# Patient Record
Sex: Female | Born: 1990 | Race: White | Hispanic: Yes | Marital: Single | Smoking: Never smoker
Health system: Southern US, Community
[De-identification: ages and names within clinical notes are randomized; demographics above are authoritative.]

## PROBLEM LIST (undated history)

## (undated) DIAGNOSIS — E119 Type 2 diabetes mellitus without complications: Secondary | ICD-10-CM

## (undated) DIAGNOSIS — D563 Thalassemia minor: Secondary | ICD-10-CM

## (undated) HISTORY — DX: Type 2 diabetes mellitus without complications: E11.9

## (undated) HISTORY — DX: Thalassemia minor: D56.3

## (undated) HISTORY — PX: NO PAST SURGERIES: SHX2092

---
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
## 2011-03-02 LAB — POCT PREGNANCY, URINE: Preg Test, Ur: NEGATIVE

## 2011-03-02 LAB — POCT I-STAT, CHEM 8
BUN: 9 mg/dL (ref 6–23)
Chloride: 106 mEq/L (ref 96–112)
Creatinine, Ser: 0.6 mg/dL (ref 0.4–1.2)
Glucose, Bld: 100 mg/dL — ABNORMAL HIGH (ref 70–99)
Potassium: 4 mEq/L (ref 3.5–5.1)

## 2011-03-02 LAB — HEPATIC FUNCTION PANEL
ALT: 20 U/L (ref 0–35)
AST: 23 U/L (ref 0–37)
Albumin: 4.1 g/dL (ref 3.5–5.2)
Bilirubin, Direct: 0.1 mg/dL (ref 0.0–0.3)
Total Protein: 7.9 g/dL (ref 6.0–8.3)

## 2011-10-23 DIAGNOSIS — L0501 Pilonidal cyst with abscess: Secondary | ICD-10-CM | POA: Insufficient documentation

## 2017-05-03 ENCOUNTER — Ambulatory Visit (INDEPENDENT_AMBULATORY_CARE_PROVIDER_SITE_OTHER): Payer: 59 | Admitting: Obstetrics and Gynecology

## 2017-05-03 ENCOUNTER — Encounter: Payer: Self-pay | Admitting: Obstetrics and Gynecology

## 2017-05-03 VITALS — BP 131/87 | HR 91 | Ht 62.0 in | Wt 179.0 lb

## 2017-05-03 DIAGNOSIS — E119 Type 2 diabetes mellitus without complications: Secondary | ICD-10-CM | POA: Insufficient documentation

## 2017-05-03 DIAGNOSIS — Z3689 Encounter for other specified antenatal screening: Secondary | ICD-10-CM

## 2017-05-03 DIAGNOSIS — O99211 Obesity complicating pregnancy, first trimester: Secondary | ICD-10-CM | POA: Diagnosis not present

## 2017-05-03 DIAGNOSIS — O0991 Supervision of high risk pregnancy, unspecified, first trimester: Secondary | ICD-10-CM

## 2017-05-03 DIAGNOSIS — O99210 Obesity complicating pregnancy, unspecified trimester: Secondary | ICD-10-CM | POA: Insufficient documentation

## 2017-05-03 DIAGNOSIS — E669 Obesity, unspecified: Secondary | ICD-10-CM | POA: Diagnosis not present

## 2017-05-03 DIAGNOSIS — O0990 Supervision of high risk pregnancy, unspecified, unspecified trimester: Secondary | ICD-10-CM | POA: Insufficient documentation

## 2017-05-03 DIAGNOSIS — B3731 Acute candidiasis of vulva and vagina: Secondary | ICD-10-CM

## 2017-05-03 DIAGNOSIS — Z3401 Encounter for supervision of normal first pregnancy, first trimester: Secondary | ICD-10-CM

## 2017-05-03 DIAGNOSIS — Z113 Encounter for screening for infections with a predominantly sexual mode of transmission: Secondary | ICD-10-CM

## 2017-05-03 DIAGNOSIS — E118 Type 2 diabetes mellitus with unspecified complications: Secondary | ICD-10-CM

## 2017-05-03 DIAGNOSIS — B373 Candidiasis of vulva and vagina: Secondary | ICD-10-CM

## 2017-05-03 NOTE — Progress Notes (Signed)
New OB Note  05/03/2017   Clinic: [facility]  Chief Complaint: NOB  Transfer of Care Patient: no  History of Present Illness: Haley [name] is a 26 y.o. G1 @ 7/1 weeks (Declo 1/23, based on Patient's last menstrual period was 03/14/2017 (exact date).=7wk u/s).  Preg complicated by has Supervision of high risk pregnancy, antepartum, first trimester; Obesity in pregnancy, antepartum; Obesity (BMI 30-39.9); and ?DM2 on her problem list.   Any events prior to today's visit: no Her periods were: regular, qmonth She was using no method when she conceived.  She has Positive signs or symptoms of nausea/vomiting of pregnancy. Very mild nausea She has Negative signs or symptoms of miscarriage or preterm labor. Had some spotting today.  On any different medications around the time she conceived/early pregnancy: No   ROS: A 12-point review of systems was performed and negative, except as stated in the above HPI.  OBGYN History: As per HPI. OB History  Gravida Para Term Preterm AB Living  1            SAB TAB Ectopic Multiple Live Births               # Outcome Date GA Lbr Len/2nd Weight Sex Delivery Anes PTL Lv  1 Current              Any issues with any prior pregnancies: not applicable Prior children are healthy, doing well, and without any problems or issues: not applicable History of pap smears: Yes. Last pap smear 2016 @ [facility] and results were negative per patient History of STIs: No   Past Medical History: Past Medical History:  Diagnosis Date  . Diabetes mellitus without complication [facility])     Past Surgical History: Past Surgical History:  Procedure Laterality Date  . NO PAST SURGERIES      Family History:  No family history on file. She denies any female cancers, bleeding or blood clotting disorders except for a grandmother with breast cancer in her 73s  She denies any history of mental retardation, birth defects or genetic disorders in her or the  FOB's history  Social History:  Social History   Social History  . Marital status: Single    Spouse name: N/A  . Number of children: N/A  . Years of education: N/A   Occupational History  . Not on file.   Social History Main Topics  . Smoking status: Never Smoker  . Smokeless tobacco: Never Used  . Alcohol use Yes     Comment: prior to known pregnancy  . Drug use: No  . Sexual activity: Yes    Birth control/ protection: None   Other Topics Concern  . Not on file   Social History Narrative  . No narrative on file  works as Theatre manager (precautions given)  Allergy: No Known Allergies  Health Maintenance:  Mammogram Up to Date: not applicable  Current Outpatient Medications: PNV  Physical Exam:   BP 131/87   Pulse 91   Ht 5' 2"  (1.575 m)   Wt 179 lb (81.2 kg)   LMP 03/14/2017 (Exact Date)   BMI 32.74 kg/m  Body mass index is 32.74 kg/m. Contractions: Not present Vag. Bleeding: Scant. Fundal height: not applicable FHTs: 867E  General appearance: Well nourished, well developed female in no acute distress.  Neck:  Supple, normal appearance, and no thyromegaly  Cardiovascular: S1, S2 normal, no murmur, rub or gallop, regular rate and rhythm Respiratory:  Clear to  auscultation bilateral. Normal respiratory effort Abdomen: positive bowel sounds and no masses, hernias; diffusely non tender to palpation, non distended Breasts: breasts appear normal, no suspicious masses, no skin or nipple changes or axillary nodes, normal palpation. Neuro/Psych:  Normal mood and affect.  Skin:  Warm and dry.  Lymphatic:  No inguinal lymphadenopathy.   Pelvic exam: is not limited by body habitus EGBUS: within normal limits, Vagina: within normal limits and with no blood in the vault, +cottage white d/c in vault  Cervix: normal appearing cervix without discharge or lesions, closed/long/high, Uterus:  nonenlarged, and Adnexa:  normal adnexa and no mass, fullness,  tenderness  Laboratory: none  Imaging:  Per RN note: SLIUP 7/0, FHR 140s  Assessment: pt stable  Plan: 1. Encounter for supervision of normal first pregnancy in first trimester Routine care. Declines 1st trimester screen. Behavioral methods for n/v of pregnancy d/w pt.  - [place] bedside; Future - Obstetric Panel, Including HIV - Cystic Fibrosis Mutation 97 - Hemoglobinopathy Evaluation - Culture, OB Urine - Urine cytology ancillary only - Glucose tolerance, 1 hour - Comp Met (CMET) - TSH - Protein / creatinine ratio, urine - SMN1 Copy Number Analysis - HgB A1c  2. Supervision of high risk pregnancy, antepartum, first trimester See above  3. Obesity in pregnancy, antepartum Baseline pre-x labs, tsh  4. Obesity (BMI 30-39.9) See above  5. Type 2 diabetes mellitus with complication, unspecified whether long term insulin use (HCC) Pt states she went to [facility] and she had actual DM2 and not pre-DM and was on insulin for a time but came off it b/c she was able to switch her diet. Last seen in the last year or two. MR request sent to [facility] and [facility] for pap results. Early 1h and a1c done.   Problem list reviewed and updated.  Follow up in 4 weeks.  The nature of [facility] with multiple MDs and other Advanced Practice Providers was explained to patient; also emphasized that residents, students are part of our team.  >50% of 25 min visit spent on counseling and coordination of care.     Durene Romans MD Attending [facility] for [facility])

## 2017-05-03 NOTE — Progress Notes (Signed)
Bedside TV US performed, SIUP noted with + FHR 145 and CRL measuring 7w 0d which correlates with LMP.

## 2017-05-04 LAB — URINE CYTOLOGY ANCILLARY ONLY
Chlamydia: NEGATIVE
Neisseria Gonorrhea: NEGATIVE

## 2017-05-07 ENCOUNTER — Encounter: Payer: Self-pay | Admitting: *Deleted

## 2017-05-11 ENCOUNTER — Encounter: Payer: Self-pay | Admitting: Obstetrics and Gynecology

## 2017-05-11 DIAGNOSIS — D563 Thalassemia minor: Secondary | ICD-10-CM

## 2017-05-11 HISTORY — DX: Thalassemia minor: D56.3

## 2017-05-11 LAB — SMN1 COPY NUMBER ANALYSIS (SMA CARRIER SCREENING)

## 2017-05-11 LAB — HEMOGLOBINOPATHY EVALUATION
Ferritin: 29 ng/mL (ref 15–150)
HGB SOLUBILITY: NEGATIVE
HGB VARIANT: 0 %
Hgb A2 Quant: 2.1 % (ref 1.8–3.2)
Hgb A: 97.9 % (ref 96.4–98.8)
Hgb C: 0 %
Hgb F Quant: 0 % (ref 0.0–2.0)
Hgb S: 0 %

## 2017-05-11 LAB — COMPREHENSIVE METABOLIC PANEL
A/G RATIO: 1.3 (ref 1.2–2.2)
ALK PHOS: 65 IU/L (ref 39–117)
ALT: 12 IU/L (ref 0–32)
AST: 15 IU/L (ref 0–40)
Albumin: 4.3 g/dL (ref 3.5–5.5)
BUN/Creatinine Ratio: 14 (ref 9–23)
BUN: 10 mg/dL (ref 6–20)
CHLORIDE: 97 mmol/L (ref 96–106)
CO2: 19 mmol/L (ref 18–29)
Calcium: 9.2 mg/dL (ref 8.7–10.2)
Creatinine, Ser: 0.7 mg/dL (ref 0.57–1.00)
GFR calc Af Amer: 139 mL/min/{1.73_m2} (ref >59)
GFR calc non Af Amer: 121 mL/min/{1.73_m2} (ref >59)
Globulin, Total: 3.2 g/dL (ref 1.5–4.5)
Glucose: 444 mg/dL — ABNORMAL HIGH (ref 65–99)
POTASSIUM: 4.3 mmol/L (ref 3.5–5.2)
Sodium: 133 mmol/L — ABNORMAL LOW (ref 134–144)
Total Protein: 7.5 g/dL (ref 6.0–8.5)

## 2017-05-11 LAB — OBSTETRIC PANEL, INCLUDING HIV
Antibody Screen: NEGATIVE
BASOS ABS: 0 10*3/uL (ref 0.0–0.2)
Basos: 0 %
EOS (ABSOLUTE): 0.1 10*3/uL (ref 0.0–0.4)
Eos: 1 %
HEP B S AG: NEGATIVE
HIV Screen 4th Generation wRfx: NONREACTIVE
Hematocrit: 41.7 % (ref 34.0–46.6)
Hemoglobin: 13.2 g/dL (ref 11.1–15.9)
IMMATURE GRANS (ABS): 0 10*3/uL (ref 0.0–0.1)
IMMATURE GRANULOCYTES: 0 %
LYMPHS ABS: 1.5 10*3/uL (ref 0.7–3.1)
LYMPHS: 19 %
MCH: 24.6 pg — AB (ref 26.6–33.0)
MCHC: 31.7 g/dL (ref 31.5–35.7)
MCV: 78 fL — ABNORMAL LOW (ref 79–97)
Monocytes Absolute: 0.3 10*3/uL (ref 0.1–0.9)
Monocytes: 4 %
NEUTROS PCT: 76 %
Neutrophils Absolute: 6.2 10*3/uL (ref 1.4–7.0)
PLATELETS: 179 10*3/uL (ref 150–379)
RBC: 5.36 x10E6/uL — AB (ref 3.77–5.28)
RDW: 15.7 % — ABNORMAL HIGH (ref 12.3–15.4)
RPR: NONREACTIVE
Rh Factor: POSITIVE
Rubella Antibodies, IGG: 1.94 index (ref >0.99)
WBC: 8.1 10*3/uL (ref 3.4–10.8)

## 2017-05-11 LAB — URINE CULTURE, OB REFLEX

## 2017-05-11 LAB — PROTEIN / CREATININE RATIO, URINE: CREATININE, UR: 26.9 mg/dL

## 2017-05-11 LAB — TSH: TSH: 1.37 u[IU]/mL (ref 0.450–4.500)

## 2017-05-11 LAB — HEMOGLOBIN A1C
ESTIMATED AVERAGE GLUCOSE: 232 mg/dL
Hgb A1c MFr Bld: 9.7 % — ABNORMAL HIGH (ref 4.8–5.6)

## 2017-05-11 LAB — CYSTIC FIBROSIS MUTATION 97: Interpretation: NOT DETECTED

## 2017-05-11 LAB — ALPHA-THALASSEMIA

## 2017-05-11 LAB — CULTURE, OB URINE

## 2017-05-11 LAB — GLUCOSE TOLERANCE, 1 HOUR: GLUCOSE, 1HR PP: 446 mg/dL — AB (ref 65–199)

## 2017-05-12 ENCOUNTER — Encounter: Payer: Self-pay | Admitting: Obstetrics and Gynecology

## 2017-05-12 DIAGNOSIS — R8271 Bacteriuria: Secondary | ICD-10-CM | POA: Insufficient documentation

## 2017-05-13 ENCOUNTER — Telehealth: Payer: Self-pay | Admitting: Obstetrics and Gynecology

## 2017-05-13 NOTE — Telephone Encounter (Signed)
OB Telephone Note Patient called at [phone] but it states it's not in service. Patient called at [phone] and generic VM and pt [facility] and ask to speak to attending on call to go over lab results. If she doesn't get VM until business hours on Monday, pt told to call the clinic  Patient needs to be told: 1) she has poorly controlled diabetes and needs to start checking her blood sugars and likely needs to at least be started on metformin while more BS numbers are obtained 2) pt needs to tell us pharmacy to send in keflex for +UCx and if we need to send in any DM testing supplies   Cornelia Copaharlie Aristide Waggle, Jr MD Attending [facility] 05/13/2017 Time: 1152am

## 2017-05-14 ENCOUNTER — Telehealth: Payer: Self-pay | Admitting: *Deleted

## 2017-05-14 ENCOUNTER — Telehealth: Payer: Self-pay | Admitting: Obstetrics and Gynecology

## 2017-05-14 ENCOUNTER — Encounter: Payer: Self-pay | Admitting: *Deleted

## 2017-05-14 DIAGNOSIS — E118 Type 2 diabetes mellitus with unspecified complications: Secondary | ICD-10-CM

## 2017-05-14 MED ORDER — CEPHALEXIN 500 MG PO CAPS: 500.0000 mg | ORAL_CAPSULE | Freq: Three times a day (TID) | ORAL | 0 refills | Status: DC

## 2017-05-14 MED ORDER — METFORMIN HCL 1000 MG PO TABS: 1000.0000 mg | ORAL_TABLET | Freq: Two times a day (BID) | ORAL | 3 refills | Status: DC

## 2017-05-14 MED ORDER — ACCU-CHEK FASTCLIX LANCETS MISC: 1.0000 [IU] | Freq: Four times a day (QID) | 12 refills | Status: AC

## 2017-05-14 MED ORDER — GLUCOSE BLOOD VI STRP: ORAL_STRIP | 12 refills | Status: DC

## 2017-05-14 MED ORDER — ACCU-CHEK NANO SMARTVIEW W/DEVICE KIT: 1.0000 | PACK | 0 refills | Status: AC

## 2017-05-14 NOTE — Telephone Encounter (Signed)
OB Telephone Note  Patient called at [phone]. VM again left  Cornelia Copaharlie Esdras Delair, Jr MD Attending [facility] 05/14/2017 Time: 1357

## 2017-05-14 NOTE — Telephone Encounter (Signed)
Spoke to pt about lab results - pt understands she is to pick up meds/supplies from pharmacy and follow up next week

## 2017-05-17 ENCOUNTER — Encounter: Payer: Self-pay | Admitting: Obstetrics & Gynecology

## 2017-05-23 ENCOUNTER — Encounter: Payer: Self-pay | Admitting: Family Medicine

## 2017-05-23 ENCOUNTER — Encounter: Payer: Self-pay | Admitting: *Deleted

## 2017-05-23 ENCOUNTER — Ambulatory Visit: Payer: 59 | Admitting: Family Medicine

## 2017-05-23 NOTE — Progress Notes (Signed)
SUBJECTIVE: 26 y.o. female in office today to follow up lab results. Medication compliance: compliant all of the time.     Current Outpatient Prescriptions  Medication Sig Dispense Refill  . ACCU-CHEK FASTCLIX LANCETS MISC 1 Units by Percutaneous route 4 (four) times daily. 100 each 12  . Blood Glucose Monitoring Suppl (ACCU-CHEK NANO SMARTVIEW) w/Device KIT 1 kit by Subdermal route as directed. Check blood sugars for fasting, and two hours after breakfast, lunch and dinner (4 checks daily) 1 kit 0  . cephALEXin (KEFLEX) 500 MG capsule Take 1 capsule (500 mg total) by mouth 3 (three) times daily. 21 capsule 0  . glucose blood (ACCU-CHEK SMARTVIEW) test strip Use as instructed to check blood sugars 100 each 12  . metFORMIN (GLUCOPHAGE) 1000 MG tablet Take 1 tablet (1,000 mg total) by mouth 2 (two) times daily with a meal. 60 tablet 3  . Prenatal Vit-Fe Fumarate-FA (PRENATAL VITAMIN PO) Take 1 tablet by mouth daily.     No current facility-administered medications for this visit.     OBJECTIVE: Appearance: alert, well appearing, and in no distress, oriented to person, place, and time and well hydrated. LMP 03/14/2017 (Exact Date)   ASSESSMENT: Diabetes Mellitus: needs improvement  PLAN: Issues reviewed with her: referral to Diabetic Education department and Appointment made for tomorrow. Advised pt to start Baby ASA when she is [redacted] weeks GA.

## 2017-05-24 ENCOUNTER — Ambulatory Visit: Payer: 59 | Admitting: *Deleted

## 2017-05-28 ENCOUNTER — Ambulatory Visit (INDEPENDENT_AMBULATORY_CARE_PROVIDER_SITE_OTHER): Payer: 59 | Admitting: Obstetrics & Gynecology

## 2017-05-28 VITALS — BP 138/87 | HR 94 | Wt 180.0 lb

## 2017-05-28 DIAGNOSIS — B379 Candidiasis, unspecified: Secondary | ICD-10-CM

## 2017-05-28 DIAGNOSIS — O98811 Other maternal infectious and parasitic diseases complicating pregnancy, first trimester: Secondary | ICD-10-CM

## 2017-05-28 DIAGNOSIS — B373 Candidiasis of vulva and vagina: Secondary | ICD-10-CM

## 2017-05-28 DIAGNOSIS — O0991 Supervision of high risk pregnancy, unspecified, first trimester: Secondary | ICD-10-CM

## 2017-05-28 DIAGNOSIS — O24319 Unspecified pre-existing diabetes mellitus in pregnancy, unspecified trimester: Secondary | ICD-10-CM

## 2017-05-28 DIAGNOSIS — O24311 Unspecified pre-existing diabetes mellitus in pregnancy, first trimester: Secondary | ICD-10-CM

## 2017-05-28 DIAGNOSIS — Z3689 Encounter for other specified antenatal screening: Secondary | ICD-10-CM

## 2017-05-28 MED ORDER — ASPIRIN EC 81 MG PO TBEC: 81.0000 mg | DELAYED_RELEASE_TABLET | Freq: Every day | ORAL | 2 refills | Status: DC

## 2017-05-28 MED ORDER — GLYBURIDE 2.5 MG PO TABS: 2.5000 mg | ORAL_TABLET | Freq: Two times a day (BID) | ORAL | 3 refills | Status: DC

## 2017-05-28 MED ORDER — TERCONAZOLE 0.4 % VA CREA: 1.0000 | TOPICAL_CREAM | Freq: Every day | VAGINAL | 0 refills | Status: DC

## 2017-05-28 NOTE — Progress Notes (Signed)
   PRENATAL VISIT NOTE  Subjective:  Haley [name] is a 26 y.o. G1P0000 at 87w5dbeing seen today for ongoing prenatal care.  She is currently monitored for the following issues for this high-risk pregnancy and has Supervision of high risk pregnancy, antepartum, first trimester; Obesity in pregnancy, antepartum; Obesity (BMI 30-39.9); DM (diabetes mellitus), type 2 (HColbert; Alpha thalassemia trait; GBS bacteriuria; and Preexisting diabetes complicating pregnancy, antepartum on her problem list.  Patient reports no complaints.  Contractions: Not present. Vag. Bleeding: None.  Movement: Absent. Denies leaking of fluid.   The following portions of the patient's history were reviewed and updated as appropriate: allergies, current medications, past family history, past medical history, past social history, past surgical history and problem list. Problem list updated.  Objective:   Vitals:   05/28/17 1432  BP: 138/87  Pulse: 94  Weight: 180 lb (81.6 kg)    Fetal Status: Fetal Heart Rate (bpm): 175-US   Movement: Absent     General:  Alert, oriented and cooperative. Patient is in no acute distress.  Skin: Skin is warm and dry. No rash noted.   Cardiovascular: Normal heart rate noted  Respiratory: Normal respiratory effort, no problems with respiration noted  Abdomen: Soft, gravid, appropriate for gestational age. Pain/Pressure: Present     Pelvic:  Cervical exam deferred        Extremities: Normal range of motion.  Edema: None  Mental Status: Normal mood and affect. Normal behavior. Normal judgment and thought content.   Assessment and Plan:  Pregnancy: G1P0000 at 127w5d1. Preexisting diabetes complicating pregnancy, antepartum Reports elevated blood sugars in 160s-190s for fastings and evenings.  On Metformin 1000 mg daily. Has not met with DM education yet.  Recent HgA1C was 9.7.  Will add Glyburide 2.5 mg bid for now, instructed about how to check her blood sugars, proper diet and  exercise recommended. Will start Aspirin in about 1.5 weeks.  Anatomy scan and fetal ECHO ordered; she will also get eye exam soon.  - glyBURIDE (DIABETA) 2.5 MG tablet; Take 1 tablet (2.5 mg total) by mouth 2 (two) times daily with a meal.  Dispense: 60 tablet; Refill: 3 - aspirin EC 81 MG tablet; Take 1 tablet (81 mg total) by mouth daily. Take after 12 weeks for prevention of preeclampsia later in pregnancy  Dispense: 300 tablet; Refill: 2 - [place] OB DETAIL +14 WK; Future - [place] Echocardiography; Future  2. Yeast infection Reports having yeast infection after recent antibiotics. - terconazole (TERAZOL 7) 0.4 % vaginal cream; Place 1 applicator vaginally at bedtime. Use for seven days  Dispense: 45 g; Refill: 0  3. Encounter for fetal anatomic survey Anatomy scan ordered - [place] OB DETAIL +14 WK; Future  4. Supervision of high risk pregnancy, antepartum, first trimester Declined first trimester screening; considering NIPS. No other complaints or concerns.  Routine obstetric precautions reviewed. Please refer to After Visit Summary for other counseling recommendations.  Return in about 2 weeks (around 06/11/2017) for OB Visit.   UgVerita SchneidersMD

## 2017-05-28 NOTE — Progress Notes (Signed)
Pt here today for OB fu, was not able to keep appt with Nutrition and Diabetes management.  Pt reports fasting BS 160-190s and in the evenings as well.

## 2017-05-28 NOTE — Patient Instructions (Signed)
Type 1 or Type 2 Diabetes Mellitus During Pregnancy, Self Care Caring for yourself during your pregnancy when you have type 1 diabetes (type 1 diabetes mellitus) or type 2 diabetes (type 2 diabetes mellitus) means keeping your blood sugar (glucose) under control with a balance of:  Nutrition.  Exercise.  Lifestyle changes.  Insulin or medicines, if necessary.  Support from your team of health care providers and others.  The following information explains what you need to know to manage your diabetes at home during your pregnancy. What do I need to do to manage my blood glucose?  Check your blood glucose every day, as often as told by your health care provider.  Contact your health care provider if your blood glucose is above your target for 2 tests in a row.  Have your A1c (hemoglobin A1c) level checked at least two times a year, or as often as told by your health care provider. Your health care provider will set individualized treatment goals for you. Generally, the goal of treatment is to maintain the following blood glucose levels during pregnancy:  After not eating (after fasting) for 8 hours: at or below 95 mg/dL (5.3 mmol/L).  After meals (postprandial): ? One hour after a meal: at or below 140 mg/dL (7.8 mmol/L). ? Two hours after a meal: at or below 120 mg/dL (6.7 mmol/L).  A1c level: 6-6.5%  What do I need to know about hyperglycemia and hypoglycemia? What is hyperglycemia? Hyperglycemia, also called high blood glucose, occurs when blood glucose is too high. Make sure you know the early signs of hyperglycemia, such as:  Increased thirst.  Hunger.  Feeling very tired.  Needing to urinate more often than usual.  Blurry vision.  What is hypoglycemia? Hypoglycemia, also called low blood glucose, occurs with a blood glucose level at or below 70 mg/dL (3.9 mmol/L). The risk for hypoglycemia increases during or after exercise, during sleep, during illness, and when  skipping meals or fasting. It is important to know the symptoms of hypoglycemia and treat it right away. Always have a 15-gram rapid-acting carbohydrate snack with you to treat low blood glucose. Family members and close friends should also know the symptoms and should understand how to treat hypoglycemia, in case you are not able to treat yourself. What are the symptoms of hypoglycemia? Hypoglycemia symptoms can include:  Hunger.  Anxiety.  Sweating and feeling clammy.  Confusion.  Dizziness or feeling light-headed.  Sleepiness.  Nausea.  Increased heart rate.  Headache.  Blurry vision.  Seizure.  Nightmares.  Tingling or numbness around the mouth, lips, or tongue.  A change in speech.  Decreased ability to concentrate.  A change in coordination.  Restless sleep.  Tremors or shakes.  Fainting.  Irritability.  How do I treat hypoglycemia?  If you are alert and able to swallow safely, follow the 15:15 rule:  Take 15 grams of a rapid-acting carbohydrate. Rapid-acting options include: ? 1 tube of glucose gel. ? 3 glucose pills. ? 6-8 pieces of hard candy. ? 4 oz (120 mL) of fruit juice . ? 4 oz (120 mL) of regular (not diet) soda.  Check your blood glucose 15 minutes after you take the carbohydrate.  If the repeat blood glucose level is still at or below 70 mg/dL (3.9 mmol/L), take 15 grams of a carbohydrate again.  If your blood glucose level does not increase above 70 mg/dL (3.9 mmol/L) after 3 tries, seek emergency medical care.  After your blood glucose level returns   to normal, eat a meal or a snack within 1 hour.  How do I treat severe hypoglycemia? Severe hypoglycemia is when your blood glucose level is at or below 54 mg/dL (3 mmol/L). Severe hypoglycemia is an emergency. Do not wait to see if the symptoms will go away. Get medical help right away. Call your local emergency services (911 in the [place] Do not drive yourself to the hospital. If you  have severe hypoglycemia and you cannot eat or drink, you may need an injection of glucagon. A family member or close friend should learn how to check your blood glucose and how to give you a glucagon injection. Ask your health care provider if you need to have an emergency glucagon injection kit available. Severe hypoglycemia may need to be treated in a hospital. The treatment may include getting glucose through an IV tube. You may also need treatment for the cause of your hypoglycemia. Can having diabetes put me at risk for other conditions? Having diabetes can put you at risk for other long-term (chronic) conditions, such as heart disease and kidney disease. Your health care provider may prescribe medicines to help prevent complications from diabetes. These medicines may include:  Aspirin.  Medicine to lower cholesterol.  Medicine to control blood pressure.  What else can I do to manage my diabetes? Take your diabetes medicines as told  If your health care provider prescribed insulin or diabetes medicines, take them every day.  Do not run out of insulin or other diabetes medicines that you take. Plan ahead so you always have these available.  If you use insulin, adjust your dosage based on how physically active you are and what foods you eat. Your health care provider will tell you how to adjust your dosage. Your health care provider may recommend that you take one low-dose aspirin (81 mg) each day to help prevent high blood pressure during pregnancy (preeclampsia or eclampsia). You may be at risk for preeclampsia or eclampsia if:  You had any of the following during a previous pregnancy: ? Preeclampsia or eclampsia. ? A fetal growth rate that was slower than normal. ? An early (preterm) birth. ? Separation of the placenta from the uterus (placental abruption). ? Fetal loss.  You are pregnant with more than one baby.  You have other medical conditions, such as high blood pressure or  an autoimmune disease.  Make healthy food choices  The things that you eat and drink affect your blood glucose and your insulin dosage. Making good choices helps to control your diabetes and prevent other health problems. A healthy meal plan includes eating lean proteins, complex carbohydrates, fresh fruits and vegetables, low-fat dairy products, and healthy fats. Make an appointment to see a diet and nutrition specialist (registered dietitian) to help you create an eating plan that is right for you. Make sure that you:  Follow instructions from your health care provider about eating or drinking restrictions.  Drink enough fluid to keep your urine clear or pale yellow.  Eat healthy snacks between nutritious meals.  Track the carbohydrates that you eat. Do this by reading food labels and learning the standard serving sizes of foods.  Follow your sick day plan whenever you cannot eat or drink as usual. Make this plan in advance with your health care provider.  Stay active   Do at least 30 minutes of physical activity a day, or as much physical activity as your health care provider recommends during your pregnancy.  If you start   a new exercise or activity, work with your health care provider to adjust your insulin, medicines, or food intake as needed. Make healthy lifestyle choices  Do not use any tobacco products, such as cigarettes, chewing tobacco, and e-cigarettes. If you need help quitting, ask your health care provider.  Do not use alcohol.  Learn to manage stress. If you need help with this, ask your health care provider. Care for your body  Keep your immunizations up to date.  Schedule an eye exam during your first trimester of your pregnancy, or as told by your health care provider.  Check your skin and feet every day for cuts, bruises, redness, blisters, or sores. Schedule a foot exam with your health care provider once every year.  Brush your teeth and gums two times a  day, and floss at least one time a day. Visit your dentist at least once every 6 months.  Maintain a healthy weight during your pregnancy. General instructions   Take over-the-counter and prescription medicines only as told by your health care provider.  Talk with your health care provider about your risk for high blood pressure during pregnancy (preeclampsia or eclampsia).  Share your diabetes management plan with people in your workplace, school, and household.  Check your urine ketones when you are ill and as told by your health care provider.  Carry a medical alert card or wear medical alert jewelry.  Ask your health care provider: ? Do I need to meet with a diabetes educator? ? Where can I find a support group for people with diabetes?  Keep all follow-up visits during your pregnancy (prenatal) and after delivery (postnatal) as told by your health care provider. This is important. Where to find more information: For more information about diabetes, visit:  American Diabetes Association (ADA): www.diabetes.org  American Association of Diabetes Educators (AADE): https://www.diabeteseducator.org/patient-resources  This information is not intended to replace advice given to you by your health care provider. Make sure you discuss any questions you have with your health care provider. Document Released: 03/06/2016 Document Revised: 04/20/2016 Document Reviewed: 12/17/2015 Elsevier Interactive Patient Education  [employer].

## 2017-05-30 NOTE — Progress Notes (Signed)
Error, saw rn

## 2017-06-06 ENCOUNTER — Encounter: Payer: 59 | Admitting: Family Medicine

## 2017-06-11 ENCOUNTER — Encounter: Payer: 59 | Admitting: Obstetrics and Gynecology

## 2017-06-12 ENCOUNTER — Encounter: Payer: Self-pay | Admitting: Registered"

## 2017-06-12 ENCOUNTER — Encounter: Payer: Medicaid Other | Attending: Obstetrics & Gynecology | Admitting: Registered"

## 2017-06-12 ENCOUNTER — Ambulatory Visit (INDEPENDENT_AMBULATORY_CARE_PROVIDER_SITE_OTHER): Payer: Medicaid Other | Admitting: Obstetrics & Gynecology

## 2017-06-12 VITALS — BP 122/76 | HR 90 | Wt 184.0 lb

## 2017-06-12 DIAGNOSIS — Z713 Dietary counseling and surveillance: Secondary | ICD-10-CM | POA: Insufficient documentation

## 2017-06-12 DIAGNOSIS — D563 Thalassemia minor: Secondary | ICD-10-CM

## 2017-06-12 DIAGNOSIS — O24119 Pre-existing diabetes mellitus, type 2, in pregnancy, unspecified trimester: Secondary | ICD-10-CM | POA: Diagnosis not present

## 2017-06-12 DIAGNOSIS — O24319 Unspecified pre-existing diabetes mellitus in pregnancy, unspecified trimester: Secondary | ICD-10-CM

## 2017-06-12 DIAGNOSIS — Z3A00 Weeks of gestation of pregnancy not specified: Secondary | ICD-10-CM | POA: Diagnosis not present

## 2017-06-12 DIAGNOSIS — O0991 Supervision of high risk pregnancy, unspecified, first trimester: Secondary | ICD-10-CM

## 2017-06-12 DIAGNOSIS — R8271 Bacteriuria: Secondary | ICD-10-CM

## 2017-06-12 MED ORDER — GLYBURIDE 5 MG PO TABS: 5.0000 mg | ORAL_TABLET | Freq: Two times a day (BID) | ORAL | 5 refills | Status: DC

## 2017-06-12 NOTE — Patient Instructions (Signed)
Check with doctor or label about your dose of Metformin, according to chart it looks like you should be taking 1000 mg 2x day. Aim to eat 3 to 5 times per day balanced meals/snacks with carbohydrates and protein Continue checking your blood sugar 4 x day

## 2017-06-12 NOTE — Patient Instructions (Addendum)
You have Alpha Thalassemia Trait, need boyfriend tested for for Hemoglobinopathy Evaluation   Alpha-thalassemia is a blood disorder that reduces the body's production of hemoglobin. Affected people have anemia, which can cause pale skin, weakness, fatigue, and more serious complications. Two types of alpha-thalassemia can cause health problems: the more severe type is known as Hb Bart syndrome; the milder form is called HbH disease. Hb Bart syndrome may be characterized by hydrops fetalis; severe anemia; hepatosplenomegaly; heart defects; and abnormalities of the urinary system or genitalia. Most babies with this condition are stillborn or die soon after birth. HbH disease may cause mild to moderate anemia; hepatosplenomegaly; jaundice; or bone changes. Alpha-thalassemia typically results from deletions involving the HBA1 and HBA2 genes. The inheritance is complex. No treatment is effective for Hb Bart syndrome. For HbH disease, occasional red blood cell transfusions may be needed.[2]

## 2017-06-12 NOTE — Progress Notes (Signed)
   PRENATAL VISIT NOTE  Subjective:  Haley [name] is a 26 y.o. G1P0000 at 5451w6d being seen today for ongoing prenatal care.  She is currently monitored for the following issues for this high-risk pregnancy and has Supervision of high risk pregnancy, antepartum, first trimester; Obesity in pregnancy, antepartum; Obesity (BMI 30-39.9); DM (diabetes mellitus), type 2 (HCC); Alpha thalassemia trait; GBS bacteriuria; and Preexisting diabetes complicating pregnancy, antepartum on her problem list.  Patient reports no complaints.  Contractions: Not present. Vag. Bleeding: None.  Movement: Absent. Denies leaking of fluid.   The following portions of the patient's history were reviewed and updated as appropriate: allergies, current medications, past family history, past medical history, past social history, past surgical history and problem list. Problem list updated.  Objective:   Vitals:   06/12/17 1138  BP: 122/76  Pulse: 90  Weight: 184 lb (83.5 kg)    Fetal Status: Fetal Heart Rate (bpm): 156   Movement: Absent     General:  Alert, oriented and cooperative. Patient is in no acute distress.  Skin: Skin is warm and dry. No rash noted.   Cardiovascular: Normal heart rate noted  Respiratory: Normal respiratory effort, no problems with respiration noted  Abdomen: Soft, gravid, appropriate for gestational age.  Pain/Pressure: Present     Pelvic: Cervical exam deferred        Extremities: Normal range of motion.  Edema: None  Mental Status:  Normal mood and affect. Normal behavior. Normal judgment and thought content.   Assessment and Plan:  Pregnancy: G1P0000 at 7051w6d  1. Preexisting diabetes complicating pregnancy, antepartum On review of blood sugars, fastings are 130-150s, postprandials 88-200s. Glyburide increased to 5 mg bid, continue Metformin 1000 mg bid.  Has not had DM Nutrition education yet, scheduled for this today.  After today, if sugars not improved on diet, exercise and oral  medications, will recommend insulin. - glyBURIDE (DIABETA) 5 MG tablet; Take 1 tablet (5 mg total) by mouth 2 (two) times daily. With breakfast and at bedtime  Dispense: 60 tablet; Refill: 5  2. Alpha thalassemia trait Discussed result with patient and FOB; he will get tested soon  3. GBS bacteriuria TOC done today  - Urine Culture  4. Supervision of high risk pregnancy, antepartum, first trimester No other complaints or concerns.  Routine obstetric precautions reviewed. Please refer to After Visit Summary for other counseling recommendations.  Return in about 2 weeks (around 06/26/2017) for OB Visit.   Jaynie CollinsUgonna Andi Mahaffy, MD

## 2017-06-14 NOTE — Progress Notes (Signed)
Patient was seen on 06/12/2017 for Gestational Diabetes self-management education at the [facility]. The following learning objectives were met by the patient during this course:   States the definition of Gestational Diabetes  States why dietary management is important in controlling blood glucose  Describes the effects each nutrient has on blood glucose levels  Demonstrates ability to create a balanced meal plan  Demonstrates carbohydrate counting   States when to check blood glucose levels  Demonstrates proper blood glucose monitoring techniques  States the effect of stress and exercise on blood glucose levels  States the importance of limiting caffeine and abstaining from alcohol and smoking  Patient instructed to monitor glucose levels: FBS: 60 - <95 1 hour: <140 2 hour: <120  Patient received handouts:  Nutrition Diabetes and Pregnancy  Carbohydrate Counting List  Patient will be seen for follow-up as needed.

## 2017-06-15 ENCOUNTER — Encounter: Payer: Self-pay | Admitting: Obstetrics & Gynecology

## 2017-06-15 LAB — URINE CULTURE

## 2017-06-15 MED ORDER — AMOXICILLIN 500 MG PO CAPS: 500.0000 mg | ORAL_CAPSULE | Freq: Three times a day (TID) | ORAL | 2 refills | Status: DC

## 2017-06-15 NOTE — Addendum Note (Signed)
Addended by: Jaynie CollinsANYANWU, Trevonn Hallum A on: 06/15/2017 09:44 PM   Modules accepted: Orders

## 2017-06-18 ENCOUNTER — Encounter: Payer: Self-pay | Admitting: *Deleted

## 2017-07-02 ENCOUNTER — Encounter: Payer: Medicaid Other | Admitting: Obstetrics and Gynecology

## 2017-07-05 ENCOUNTER — Ambulatory Visit (INDEPENDENT_AMBULATORY_CARE_PROVIDER_SITE_OTHER): Payer: Medicaid Other | Admitting: Obstetrics & Gynecology

## 2017-07-05 ENCOUNTER — Encounter: Payer: Medicaid Other | Admitting: Obstetrics & Gynecology

## 2017-07-05 VITALS — BP 120/75 | HR 90 | Wt 186.0 lb

## 2017-07-05 DIAGNOSIS — O24312 Unspecified pre-existing diabetes mellitus in pregnancy, second trimester: Secondary | ICD-10-CM

## 2017-07-05 DIAGNOSIS — O0992 Supervision of high risk pregnancy, unspecified, second trimester: Secondary | ICD-10-CM

## 2017-07-05 DIAGNOSIS — R8271 Bacteriuria: Secondary | ICD-10-CM

## 2017-07-05 DIAGNOSIS — O24319 Unspecified pre-existing diabetes mellitus in pregnancy, unspecified trimester: Secondary | ICD-10-CM

## 2017-07-05 MED ORDER — GLYBURIDE 5 MG PO TABS: 7.5000 mg | ORAL_TABLET | Freq: Two times a day (BID) | ORAL | 5 refills | Status: DC

## 2017-07-05 NOTE — Progress Notes (Signed)
   PRENATAL VISIT NOTE  Subjective:  Haley [name] is a 26 y.o. G1P0000 at 5962w1d being seen today for ongoing prenatal care.  She is currently monitored for the following issues for this high-risk pregnancy and has Supervision of high-risk pregnancy; Obesity in pregnancy, antepartum; Obesity (BMI 30-39.9); DM (diabetes mellitus), type 2 (HCC); Alpha thalassemia trait; GBS bacteriuria; and Preexisting diabetes complicating pregnancy, antepartum on her problem list.  Patient reports no complaints.  Contractions: Not present. Vag. Bleeding: None.  Movement: Absent. Denies leaking of fluid.   The following portions of the patient's history were reviewed and updated as appropriate: allergies, current medications, past family history, past medical history, past social history, past surgical history and problem list. Problem list updated.  Objective:   Vitals:   07/05/17 0950  BP: 120/75  Pulse: 90  Weight: 186 lb (84.4 kg)    Fetal Status: Fetal Heart Rate (bpm): 146   Movement: Absent     General:  Alert, oriented and cooperative. Patient is in no acute distress.  Skin: Skin is warm and dry. No rash noted.   Cardiovascular: Normal heart rate noted  Respiratory: Normal respiratory effort, no problems with respiration noted  Abdomen: Soft, gravid, appropriate for gestational age.  Pain/Pressure: Absent     Pelvic: Cervical exam deferred        Extremities: Normal range of motion.  Edema: None  Mental Status:  Normal mood and affect. Normal behavior. Normal judgment and thought content.   Assessment and Plan:  Pregnancy: G1P0000 at 5562w1d  1. Preexisting diabetes complicating pregnancy, antepartum Forgot BS log, reports fasting in 100s , some elevated PP. Increased Glyburide to 7.5 mg bid, continue Metformin 1000 mg bid. Patient informed we are almost at maximum dosage of Glyburide, may need to start insulin. Continue diet and exercise. Growth scan scheduled already. - glyBURIDE (DIABETA) 5  MG tablet; Take 1.5 tablets (7.5 mg total) by mouth 2 (two) times daily. With breakfast and at bedtime  Dispense: 60 tablet; Refill: 5  2. GBS bacteriuria TOC done today - Culture, OB Urine  3. Supervision of high risk pregnancy in second trimester No other complaints or concerns.  Routine obstetric precautions reviewed. Please refer to After Visit Summary for other counseling recommendations.  Return in about 1 week (around 07/12/2017) for OB Visit.   Jaynie CollinsUgonna Andris Brothers, MD

## 2017-07-05 NOTE — Patient Instructions (Signed)
Return to clinic for any scheduled appointments or obstetric concerns, or go to [facility] for evaluation  

## 2017-07-10 ENCOUNTER — Telehealth: Payer: Self-pay | Admitting: Radiology

## 2017-07-10 NOTE — Telephone Encounter (Signed)
Called patient to schedule appointment, left voicemail on cell phone to return call to [facility]

## 2017-07-13 ENCOUNTER — Other Ambulatory Visit: Payer: Self-pay | Admitting: Obstetrics and Gynecology

## 2017-07-13 LAB — URINE CULTURE, OB REFLEX

## 2017-07-13 LAB — CULTURE, OB URINE

## 2017-07-13 MED ORDER — CEPHALEXIN 500 MG PO CAPS: 500.0000 mg | ORAL_CAPSULE | Freq: Four times a day (QID) | ORAL | 0 refills | Status: DC

## 2017-07-18 ENCOUNTER — Ambulatory Visit (INDEPENDENT_AMBULATORY_CARE_PROVIDER_SITE_OTHER): Payer: Medicaid Other | Admitting: Family Medicine

## 2017-07-18 VITALS — BP 121/75 | HR 91 | Temp 98.5°F | Wt 188.2 lb

## 2017-07-18 DIAGNOSIS — R8271 Bacteriuria: Secondary | ICD-10-CM

## 2017-07-18 DIAGNOSIS — O99212 Obesity complicating pregnancy, second trimester: Secondary | ICD-10-CM

## 2017-07-18 DIAGNOSIS — D563 Thalassemia minor: Secondary | ICD-10-CM

## 2017-07-18 DIAGNOSIS — O99210 Obesity complicating pregnancy, unspecified trimester: Secondary | ICD-10-CM

## 2017-07-18 DIAGNOSIS — O24312 Unspecified pre-existing diabetes mellitus in pregnancy, second trimester: Secondary | ICD-10-CM

## 2017-07-18 DIAGNOSIS — O24319 Unspecified pre-existing diabetes mellitus in pregnancy, unspecified trimester: Secondary | ICD-10-CM

## 2017-07-18 DIAGNOSIS — O0992 Supervision of high risk pregnancy, unspecified, second trimester: Secondary | ICD-10-CM

## 2017-07-18 MED ORDER — GLYBURIDE 5 MG PO TABS: 10.0000 mg | ORAL_TABLET | Freq: Two times a day (BID) | ORAL | 5 refills | Status: DC

## 2017-07-18 NOTE — Progress Notes (Signed)
   PRENATAL VISIT NOTE  Subjective:  Haley [name] is a 26 y.o. G1P0000 at [redacted]w[redacted]d being seen today for ongoing prenatal care.  She is currently monitored for the following issues for this high-risk pregnancy and has Supervision of high-risk pregnancy; Obesity in pregnancy, antepartum; Obesity (BMI 30-39.9); DM (diabetes mellitus), type 2 (HCC); Alpha thalassemia trait; GBS bacteriuria; and Preexisting diabetes complicating pregnancy, antepartum on her problem list.  Patient reports no complaints.  Contractions: Not present. Vag. Bleeding: None.  Movement: Absent. Denies leaking of fluid.   Sibling with strep, patient with no sx today  T2GDM- very poorly controlled.  Fasting 82-254-- 13 of 14 > 120, most are > 130, only one is < 95 (82) 2hr Bfast 110-181-- 11/14 above 120 Lunch 112-218-- 11/14 above 120 Dinner 112-176--13/14 above 120  The following portions of the patient's history were reviewed and updated as appropriate: allergies, current medications, past family history, past medical history, past social history, past surgical history and problem list. Problem list updated.  Objective:   Vitals:   07/18/17 1343  BP: 121/75  Pulse: 91  Temp: 98.5 F (36.9 C)  Weight: 188 lb 3.2 oz (85.4 kg)    Fetal Status: Fetal Heart Rate (bpm): 153   Movement: Absent     General:  Alert, oriented and cooperative. Patient is in no acute distress.  Skin: Skin is warm and dry. No rash noted.   Cardiovascular: Normal heart rate noted  Respiratory: Normal respiratory effort, no problems with respiration noted  Abdomen: Soft, gravid, appropriate for gestational age.  Pain/Pressure: Absent     Pelvic: Cervical exam deferred        Extremities: Normal range of motion.     Mental Status:  Normal mood and affect. Normal behavior. Normal judgment and thought content.   Assessment and Plan:  Pregnancy: G1P0000 at [redacted]w[redacted]d  1. Preexisting diabetes complicating pregnancy, antepartum - Very poorly  controlled-- discussed with patient who is very aware of her above goal sugars.  - Discussed increase in glyburide which will be max dose and strongly possibility of starting insulin at the next visit. She was in agreement with plan.  - Recommended continued lifestyle modification-- patient currently eats sporadically. Discussed regular meals and snacks with high protein - glyBURIDE (DIABETA) 5 MG tablet; Take 2 tablets (10 mg total) by mouth 2 (two) times daily. With breakfast and at bedtime  Dispense: 60 tablet; Refill: 5 - US Fetal Echocardiography; Future - Continue ASA for PreX prophy  2. Supervision of high risk pregnancy in second trimester UTD Recommended vitamin C/zinc to prevent colds/illness.  Patient will call if she develops a sore throat-- no current sx but has a sibling with pos Group A Strep  3. Obesity in pregnancy, antepartum 18 lb 3.2 oz (8.255 kg) -- over goal of 11-15 lbs  4. GBS bacteriuria Treat in labor  5. Alpha thalassemia trait   There are no diagnoses linked to this encounter. Preterm labor symptoms and general obstetric precautions including but not limited to vaginal bleeding, contractions, leaking of fluid and fetal movement were reviewed in detail with the patient. Please refer to After Visit Summary for other counseling recommendations.   Return in about 1 week (around 07/25/2017) for DM follow up, possible insulin start.   Federico Flake, MD

## 2017-07-18 NOTE — Progress Notes (Signed)
Pt states sibling diagnosed with strep throat today, voiced concerns about exposure.

## 2017-07-27 ENCOUNTER — Ambulatory Visit (INDEPENDENT_AMBULATORY_CARE_PROVIDER_SITE_OTHER): Payer: Medicaid Other | Admitting: Obstetrics & Gynecology

## 2017-07-27 VITALS — BP 120/72 | HR 90 | Wt 187.4 lb

## 2017-07-27 DIAGNOSIS — O24319 Unspecified pre-existing diabetes mellitus in pregnancy, unspecified trimester: Secondary | ICD-10-CM

## 2017-07-27 DIAGNOSIS — O0992 Supervision of high risk pregnancy, unspecified, second trimester: Secondary | ICD-10-CM

## 2017-07-27 DIAGNOSIS — O24312 Unspecified pre-existing diabetes mellitus in pregnancy, second trimester: Secondary | ICD-10-CM

## 2017-07-27 MED ORDER — INSULIN NPH (HUMAN) (ISOPHANE) 100 UNIT/ML ~~LOC~~ SUSP: SUBCUTANEOUS | 3 refills | Status: DC

## 2017-07-27 MED ORDER — INSULIN ASPART 100 UNIT/ML ~~LOC~~ SOLN: SUBCUTANEOUS | 12 refills | Status: DC

## 2017-07-27 NOTE — Progress Notes (Signed)
   PRENATAL VISIT NOTE  Subjective:  Haley [name] is a 26 y.o. G1P0000 at 6337w2d being seen today for ongoing prenatal care.  She is currently monitored for the following issues for this high-risk pregnancy and has Supervision of high-risk pregnancy; Obesity in pregnancy, antepartum; Obesity (BMI 30-39.9); DM (diabetes mellitus), type 2 (HCC); Alpha thalassemia trait; GBS bacteriuria; and Preexisting diabetes complicating pregnancy, antepartum on her problem list.  Patient reports no complaints.  Contractions: Not present. Vag. Bleeding: None.  Movement: Present. Denies leaking of fluid.   The following portions of the patient's history were reviewed and updated as appropriate: allergies, current medications, past family history, past medical history, past social history, past surgical history and problem list. Problem list updated.  Objective:   Vitals:   07/27/17 0843  BP: 120/72  Pulse: 90  Weight: 187 lb 6.4 oz (85 kg)    Fetal Status: Fetal Heart Rate (bpm): 160   Movement: Present     General:  Alert, oriented and cooperative. Patient is in no acute distress.  Skin: Skin is warm and dry. No rash noted.   Cardiovascular: Normal heart rate noted  Respiratory: Normal respiratory effort, no problems with respiration noted  Abdomen: Soft, gravid, appropriate for gestational age.  Pain/Pressure: Absent     Pelvic: Cervical exam deferred        Extremities: Normal range of motion.  Edema: None  Mental Status:  Normal mood and affect. Normal behavior. Normal judgment and thought content.     Assessment and Plan:  Pregnancy: G1P0000 at 6037w2d  1. Preexisting diabetes complicating pregnancy, antepartum Based on weight and review of sugars, patient was started on insulin: NPH 20/10 and Novolog 15/10. Continue Metformin 1000 mg po bid.  Will reevaluate in one week. Growth scan and fetal ECHO scheduled.  2. Supervision of high risk pregnancy in second trimester No other complaints or  concerns.  Routine obstetric precautions reviewed. Declined quad screen. Please refer to After Visit Summary for other counseling recommendations.  Return in about 1 week (around 08/03/2017) for OB Visit.   Jaynie CollinsUgonna Shagun Wordell, MD

## 2017-07-27 NOTE — Patient Instructions (Signed)
Return to clinic for any scheduled appointments or obstetric concerns, or go to [facility] for evaluation  

## 2017-07-27 NOTE — Progress Notes (Signed)
Pt voided while waiting in lobby; unable to void again.

## 2017-07-31 ENCOUNTER — Ambulatory Visit (HOSPITAL_COMMUNITY)
Admission: RE | Admit: 2017-07-31 | Discharge: 2017-07-31 | Disposition: A | Discharge status: Home / Self Care | Payer: Medicaid Other | Source: Ambulatory Visit | Attending: Obstetrics & Gynecology | Admitting: Obstetrics & Gynecology

## 2017-07-31 DIAGNOSIS — O24112 Pre-existing diabetes mellitus, type 2, in pregnancy, second trimester: Secondary | ICD-10-CM | POA: Diagnosis not present

## 2017-07-31 DIAGNOSIS — O24319 Unspecified pre-existing diabetes mellitus in pregnancy, unspecified trimester: Secondary | ICD-10-CM

## 2017-07-31 DIAGNOSIS — Z3A19 19 weeks gestation of pregnancy: Secondary | ICD-10-CM | POA: Insufficient documentation

## 2017-07-31 DIAGNOSIS — O99012 Anemia complicating pregnancy, second trimester: Secondary | ICD-10-CM | POA: Diagnosis not present

## 2017-07-31 DIAGNOSIS — Z3689 Encounter for other specified antenatal screening: Secondary | ICD-10-CM | POA: Diagnosis not present

## 2017-07-31 DIAGNOSIS — O99212 Obesity complicating pregnancy, second trimester: Secondary | ICD-10-CM | POA: Diagnosis present

## 2017-08-03 ENCOUNTER — Ambulatory Visit (INDEPENDENT_AMBULATORY_CARE_PROVIDER_SITE_OTHER): Payer: Medicaid Other | Admitting: Obstetrics & Gynecology

## 2017-08-03 VITALS — BP 109/71 | HR 89 | Wt 189.0 lb

## 2017-08-03 DIAGNOSIS — O24312 Unspecified pre-existing diabetes mellitus in pregnancy, second trimester: Secondary | ICD-10-CM

## 2017-08-03 DIAGNOSIS — O0992 Supervision of high risk pregnancy, unspecified, second trimester: Secondary | ICD-10-CM

## 2017-08-03 DIAGNOSIS — O24319 Unspecified pre-existing diabetes mellitus in pregnancy, unspecified trimester: Secondary | ICD-10-CM

## 2017-08-03 NOTE — Progress Notes (Signed)
Eval - insulin started 07/27/17 Pt has not started insulin d/t insurance coverage

## 2017-08-03 NOTE — Progress Notes (Signed)
   PRENATAL VISIT NOTE  Subjective:  Haley [name] is a 26 y.o. G1P0000 at 3257w2d being seen today for ongoing prenatal care.  She is currently monitored for the following issues for this high-risk pregnancy and has Supervision of high-risk pregnancy; Obesity in pregnancy, antepartum; Obesity (BMI 30-39.9); DM (diabetes mellitus), type 2 (HCC); Alpha thalassemia trait; GBS bacteriuria; and Preexisting diabetes complicating pregnancy, antepartum on her problem list.  Patient reports not being able to pick up insulin prescriptions due to some insurance problems. Has been taking Glyburide instead.  Contractions: Not present. Vag. Bleeding: None.  Movement: Present. Denies leaking of fluid.   The following portions of the patient's history were reviewed and updated as appropriate: allergies, current medications, past family history, past medical history, past social history, past surgical history and problem list. Problem list updated.  Objective:   Vitals:   08/03/17 0853  BP: 109/71  Pulse: 89  Weight: 189 lb (85.7 kg)    Fetal Status: Fetal Heart Rate (bpm): 148 Fundal Height: 20 cm Movement: Present     General:  Alert, oriented and cooperative. Patient is in no acute distress.  Skin: Skin is warm and dry. No rash noted.   Cardiovascular: Normal heart rate noted  Respiratory: Normal respiratory effort, no problems with respiration noted  Abdomen: Soft, gravid, appropriate for gestational age.  Pain/Pressure: Absent     Pelvic: Cervical exam deferred        Extremities: Normal range of motion.  Edema: None  Mental Status:  Normal mood and affect. Normal behavior. Normal judgment and thought content.   Assessment and Plan:  Pregnancy: G1P0000 at 5057w2d  1. Preexisting diabetes complicating pregnancy, antepartum We were able to call insurance company and pharmacy and medications were approved. She will start today as instructed at previous visit, please refer to that note for more  details.   Normal anatomy scan reviewed with patient. Follow up scan ordered. - US MFM OB FOLLOW UP; Future  2. Supervision of high risk pregnancy in second trimester Preterm labor symptoms and general obstetric precautions including but not limited to vaginal bleeding, contractions, leaking of fluid and fetal movement were reviewed in detail with the patient. Please refer to After Visit Summary for other counseling recommendations.  Return in about 1 week (around 08/10/2017) for OB Visit.   Jaynie CollinsUgonna Anyanwu, MD

## 2017-08-03 NOTE — Patient Instructions (Signed)
Return to clinic for any scheduled appointments or obstetric concerns, or go to [facility] for evaluation  

## 2017-08-06 ENCOUNTER — Encounter (INDEPENDENT_AMBULATORY_CARE_PROVIDER_SITE_OTHER): Payer: Medicaid Other | Admitting: *Deleted

## 2017-08-06 DIAGNOSIS — Z3482 Encounter for supervision of other normal pregnancy, second trimester: Secondary | ICD-10-CM

## 2017-08-07 ENCOUNTER — Ambulatory Visit (HOSPITAL_COMMUNITY)
Admission: RE | Admit: 2017-08-07 | Discharge: 2017-08-07 | Disposition: A | Discharge status: Home / Self Care | Payer: Medicaid Other | Source: Ambulatory Visit | Attending: Obstetrics & Gynecology | Admitting: Obstetrics & Gynecology

## 2017-08-07 DIAGNOSIS — O24319 Unspecified pre-existing diabetes mellitus in pregnancy, unspecified trimester: Secondary | ICD-10-CM

## 2017-08-07 DIAGNOSIS — O24112 Pre-existing diabetes mellitus, type 2, in pregnancy, second trimester: Secondary | ICD-10-CM | POA: Diagnosis not present

## 2017-08-07 DIAGNOSIS — O99012 Anemia complicating pregnancy, second trimester: Secondary | ICD-10-CM | POA: Insufficient documentation

## 2017-08-07 DIAGNOSIS — O99212 Obesity complicating pregnancy, second trimester: Secondary | ICD-10-CM | POA: Diagnosis not present

## 2017-08-07 DIAGNOSIS — Z362 Encounter for other antenatal screening follow-up: Secondary | ICD-10-CM | POA: Insufficient documentation

## 2017-08-07 DIAGNOSIS — Z3A20 20 weeks gestation of pregnancy: Secondary | ICD-10-CM | POA: Diagnosis not present

## 2017-08-10 ENCOUNTER — Encounter: Payer: Medicaid Other | Admitting: Family Medicine

## 2017-08-15 ENCOUNTER — Telehealth: Payer: Self-pay | Admitting: *Deleted

## 2017-08-15 NOTE — Telephone Encounter (Signed)
Called pt to go over Glucose levels - received call from [facility] that pt has elevated reading.  LM for pt to rtn call

## 2017-08-21 ENCOUNTER — Encounter: Payer: Medicaid Other | Admitting: Obstetrics & Gynecology

## 2017-08-27 ENCOUNTER — Encounter: Payer: Self-pay | Admitting: *Deleted

## 2017-08-27 DIAGNOSIS — O24319 Unspecified pre-existing diabetes mellitus in pregnancy, unspecified trimester: Secondary | ICD-10-CM

## 2017-08-27 MED ORDER — INSULIN NPH (HUMAN) (ISOPHANE) 100 UNIT/ML ~~LOC~~ SUSP: SUBCUTANEOUS | 3 refills | Status: DC

## 2017-08-27 NOTE — Addendum Note (Signed)
Addended by: Arne Cleveland on: 08/27/2017 11:41 AM   Modules accepted: Orders

## 2017-08-27 NOTE — Progress Notes (Signed)
I left pt a message to rtn call to go over new medication instructions. Sent over MPH to pharmacy with updated instructions.

## 2017-08-27 NOTE — Progress Notes (Signed)
Email received re: blood glucose trigger. Forwarding to Dr Macon Large for review.

## 2017-08-27 NOTE — Progress Notes (Signed)
Please call patient and have her increase bedtime NPH to 15 units.  Thank you.  Jaynie Collins, MD, FACOG Attending Obstetrician & Gynecologist, [facility] for [employer], [facility]

## 2017-08-28 ENCOUNTER — Ambulatory Visit (INDEPENDENT_AMBULATORY_CARE_PROVIDER_SITE_OTHER): Payer: Medicaid Other | Admitting: Family Medicine

## 2017-08-28 VITALS — BP 109/74 | HR 86 | Wt 193.0 lb

## 2017-08-28 DIAGNOSIS — O24319 Unspecified pre-existing diabetes mellitus in pregnancy, unspecified trimester: Secondary | ICD-10-CM

## 2017-08-28 DIAGNOSIS — O24312 Unspecified pre-existing diabetes mellitus in pregnancy, second trimester: Secondary | ICD-10-CM

## 2017-08-28 DIAGNOSIS — O0992 Supervision of high risk pregnancy, unspecified, second trimester: Secondary | ICD-10-CM

## 2017-08-28 MED ORDER — INSULIN NPH (HUMAN) (ISOPHANE) 100 UNIT/ML ~~LOC~~ SUSP: SUBCUTANEOUS | 3 refills | Status: DC

## 2017-08-28 MED ORDER — INSULIN ASPART 100 UNIT/ML ~~LOC~~ SOLN: SUBCUTANEOUS | 12 refills | Status: DC

## 2017-08-28 NOTE — Patient Instructions (Signed)
 Second Trimester of Pregnancy The second trimester is from week 14 through week 27 (months 4 through 6). The second trimester is often a time when you feel your best. Your body has adjusted to being pregnant, and you begin to feel better physically. Usually, morning sickness has lessened or quit completely, you may have more energy, and you may have an increase in appetite. The second trimester is also a time when the fetus is growing rapidly. At the end of the sixth month, the fetus is about 9 inches long and weighs about 1 pounds. You will likely begin to feel the baby move (quickening) between 16 and 20 weeks of pregnancy. Body changes during your second trimester Your body continues to go through many changes during your second trimester. The changes vary from woman to woman.  Your weight will continue to increase. You will notice your lower abdomen bulging out.  You may begin to get stretch marks on your hips, abdomen, and breasts.  You may develop headaches that can be relieved by medicines. The medicines should be approved by your health care provider.  You may urinate more often because the fetus is pressing on your bladder.  You may develop or continue to have heartburn as a result of your pregnancy.  You may develop constipation because certain hormones are causing the muscles that push waste through your intestines to slow down.  You may develop hemorrhoids or swollen, bulging veins (varicose veins).  You may have back pain. This is caused by: ? Weight gain. ? Pregnancy hormones that are relaxing the joints in your pelvis. ? A shift in weight and the muscles that support your balance.  Your breasts will continue to grow and they will continue to become tender.  Your gums may bleed and may be sensitive to brushing and flossing.  Dark spots or blotches (chloasma, mask of pregnancy) may develop on your face. This will likely fade after the baby is born.  A dark line from  your belly button to the pubic area (linea nigra) may appear. This will likely fade after the baby is born.  You may have changes in your hair. These can include thickening of your hair, rapid growth, and changes in texture. Some women also have hair loss during or after pregnancy, or hair that feels dry or thin. Your hair will most likely return to normal after your baby is born.  What to expect at prenatal visits During a routine prenatal visit:  You will be weighed to make sure you and the fetus are growing normally.  Your blood pressure will be taken.  Your abdomen will be measured to track your baby's growth.  The fetal heartbeat will be listened to.  Any test results from the previous visit will be discussed.  Your health care provider may ask you:  How you are feeling.  If you are feeling the baby move.  If you have had any abnormal symptoms, such as leaking fluid, bleeding, severe headaches, or abdominal cramping.  If you are using any tobacco products, including cigarettes, chewing tobacco, and electronic cigarettes.  If you have any questions.  Other tests that may be performed during your second trimester include:  Blood tests that check for: ? Low iron levels (anemia). ? High blood sugar that affects pregnant women (gestational diabetes) between 24 and 28 weeks. ? Rh antibodies. This is to check for a protein on red blood cells (Rh factor).  Urine tests to check for infections, diabetes,   or protein in the urine.  An ultrasound to confirm the proper growth and development of the baby.  An amniocentesis to check for possible genetic problems.  Fetal screens for spina bifida and Down syndrome.  HIV (human immunodeficiency virus) testing. Routine prenatal testing includes screening for HIV, unless you choose not to have this test.  Follow these instructions at home: Medicines  Follow your health care provider's instructions regarding medicine use. Specific  medicines may be either safe or unsafe to take during pregnancy.  Take a prenatal vitamin that contains at least 600 micrograms (mcg) of folic acid.  If you develop constipation, try taking a stool softener if your health care provider approves. Eating and drinking  Eat a balanced diet that includes fresh fruits and vegetables, whole grains, good sources of protein such as meat, eggs, or tofu, and low-fat dairy. Your health care provider will help you determine the amount of weight gain that is right for you.  Avoid raw meat and uncooked cheese. These carry germs that can cause birth defects in the baby.  If you have low calcium intake from food, talk to your health care provider about whether you should take a daily calcium supplement.  Limit foods that are high in fat and processed sugars, such as fried and sweet foods.  To prevent constipation: ? Drink enough fluid to keep your urine clear or pale yellow. ? Eat foods that are high in fiber, such as fresh fruits and vegetables, whole grains, and beans. Activity  Exercise only as directed by your health care provider. Most women can continue their usual exercise routine during pregnancy. Try to exercise for 30 minutes at least 5 days a week. Stop exercising if you experience uterine contractions.  Avoid heavy lifting, wear low heel shoes, and practice good posture.  A sexual relationship may be continued unless your health care provider directs you otherwise. Relieving pain and discomfort  Wear a good support bra to prevent discomfort from breast tenderness.  Take warm sitz baths to soothe any pain or discomfort caused by hemorrhoids. Use hemorrhoid cream if your health care provider approves.  Rest with your legs elevated if you have leg cramps or low back pain.  If you develop varicose veins, wear support hose. Elevate your feet for 15 minutes, 3-4 times a day. Limit salt in your diet. Prenatal Care  Write down your questions.  Take them to your prenatal visits.  Keep all your prenatal visits as told by your health care provider. This is important. Safety  Wear your seat belt at all times when driving.  Make a list of emergency phone numbers, including numbers for family, friends, the hospital, and police and fire departments. General instructions  Ask your health care provider for a referral to a local prenatal education class. Begin classes no later than the beginning of month 6 of your pregnancy.  Ask for help if you have counseling or nutritional needs during pregnancy. Your health care provider can offer advice or refer you to specialists for help with various needs.  Do not use hot tubs, steam rooms, or saunas.  Do not douche or use tampons or scented sanitary pads.  Do not cross your legs for long periods of time.  Avoid cat litter boxes and soil used by cats. These carry germs that can cause birth defects in the baby and possibly loss of the fetus by miscarriage or stillbirth.  Avoid all smoking, herbs, alcohol, and unprescribed drugs. Chemicals in these products   can affect the formation and growth of the baby.  Do not use any products that contain nicotine or tobacco, such as cigarettes and e-cigarettes. If you need help quitting, ask your health care provider.  Visit your dentist if you have not gone yet during your pregnancy. Use a soft toothbrush to brush your teeth and be gentle when you floss. Contact a health care provider if:  You have dizziness.  You have mild pelvic cramps, pelvic pressure, or nagging pain in the abdominal area.  You have persistent nausea, vomiting, or diarrhea.  You have a bad smelling vaginal discharge.  You have pain when you urinate. Get help right away if:  You have a fever.  You are leaking fluid from your vagina.  You have spotting or bleeding from your vagina.  You have severe abdominal cramping or pain.  You have rapid weight gain or weight  loss.  You have shortness of breath with chest pain.  You notice sudden or extreme swelling of your face, hands, ankles, feet, or legs.  You have not felt your baby move in over an hour.  You have severe headaches that do not go away when you take medicine.  You have vision changes. Summary  The second trimester is from week 14 through week 27 (months 4 through 6). It is also a time when the fetus is growing rapidly.  Your body goes through many changes during pregnancy. The changes vary from woman to woman.  Avoid all smoking, herbs, alcohol, and unprescribed drugs. These chemicals affect the formation and growth your baby.  Do not use any tobacco products, such as cigarettes, chewing tobacco, and e-cigarettes. If you need help quitting, ask your health care provider.  Contact your health care provider if you have any questions. Keep all prenatal visits as told by your health care provider. This is important. This information is not intended to replace advice given to you by your health care provider. Make sure you discuss any questions you have with your health care provider. Document Released: 11/07/2001 Document Revised: 04/20/2016 Document Reviewed: 01/14/2013 [employer] Interactive Patient Education  2017 [employer]   Breastfeeding Deciding to breastfeed is one of the best choices you can make for you and your baby. A change in hormones during pregnancy causes your breast tissue to grow and increases the number and size of your milk ducts. These hormones also allow proteins, sugars, and fats from your blood supply to make breast milk in your milk-producing glands. Hormones prevent breast milk from being released before your baby is born as well as prompt milk flow after birth. Once breastfeeding has begun, thoughts of your baby, as well as his or her sucking or crying, can stimulate the release of milk from your milk-producing glands. Benefits of breastfeeding For Your  Baby  Your first milk (colostrum) helps your baby's digestive system function better.  There are antibodies in your milk that help your baby fight off infections.  Your baby has a lower incidence of asthma, allergies, and sudden infant death syndrome.  The nutrients in breast milk are better for your baby than infant formulas and are designed uniquely for your baby's needs.  Breast milk improves your baby's brain development.  Your baby is less likely to develop other conditions, such as childhood obesity, asthma, or type 2 diabetes mellitus.  For You  Breastfeeding helps to create a very special bond between you and your baby.  Breastfeeding is convenient. Breast milk is always available at   the correct temperature and costs nothing.  Breastfeeding helps to burn calories and helps you lose the weight gained during pregnancy.  Breastfeeding makes your uterus contract to its prepregnancy size faster and slows bleeding (lochia) after you give birth.  Breastfeeding helps to lower your risk of developing type 2 diabetes mellitus, osteoporosis, and breast or ovarian cancer later in life.  Signs that your baby is hungry Early Signs of Hunger  Increased alertness or activity.  Stretching.  Movement of the head from side to side.  Movement of the head and opening of the mouth when the corner of the mouth or cheek is stroked (rooting).  Increased sucking sounds, smacking lips, cooing, sighing, or squeaking.  Hand-to-mouth movements.  Increased sucking of fingers or hands.  Late Signs of Hunger  Fussing.  Intermittent crying.  Extreme Signs of Hunger Signs of extreme hunger will require calming and consoling before your baby will be able to breastfeed successfully. Do not wait for the following signs of extreme hunger to occur before you initiate breastfeeding:  Restlessness.  A loud, strong cry.  Screaming.  Breastfeeding basics Breastfeeding Initiation  Find a  comfortable place to sit or lie down, with your neck and back well supported.  Place a pillow or rolled up blanket under your baby to bring him or her to the level of your breast (if you are seated). Nursing pillows are specially designed to help support your arms and your baby while you breastfeed.  Make sure that your baby's abdomen is facing your abdomen.  Gently massage your breast. With your fingertips, massage from your chest wall toward your nipple in a circular motion. This encourages milk flow. You may need to continue this action during the feeding if your milk flows slowly.  Support your breast with 4 fingers underneath and your thumb above your nipple. Make sure your fingers are well away from your nipple and your baby's mouth.  Stroke your baby's lips gently with your finger or nipple.  When your baby's mouth is open wide enough, quickly bring your baby to your breast, placing your entire nipple and as much of the colored area around your nipple (areola) as possible into your baby's mouth. ? More areola should be visible above your baby's upper lip than below the lower lip. ? Your baby's tongue should be between his or her lower gum and your breast.  Ensure that your baby's mouth is correctly positioned around your nipple (latched). Your baby's lips should create a seal on your breast and be turned out (everted).  It is common for your baby to suck about 2-3 minutes in order to start the flow of breast milk.  Latching Teaching your baby how to latch on to your breast properly is very important. An improper latch can cause nipple pain and decreased milk supply for you and poor weight gain in your baby. Also, if your baby is not latched onto your nipple properly, he or she may swallow some air during feeding. This can make your baby fussy. Burping your baby when you switch breasts during the feeding can help to get rid of the air. However, teaching your baby to latch on properly is  still the best way to prevent fussiness from swallowing air while breastfeeding. Signs that your baby has successfully latched on to your nipple:  Silent tugging or silent sucking, without causing you pain.  Swallowing heard between every 3-4 sucks.  Muscle movement above and in front of his or her   ears while sucking.  Signs that your baby has not successfully latched on to nipple:  Sucking sounds or smacking sounds from your baby while breastfeeding.  Nipple pain.  If you think your baby has not latched on correctly, slip your finger into the corner of your baby's mouth to break the suction and place it between your baby's gums. Attempt breastfeeding initiation again. Signs of Successful Breastfeeding Signs from your baby:  A gradual decrease in the number of sucks or complete cessation of sucking.  Falling asleep.  Relaxation of his or her body.  Retention of a small amount of milk in his or her mouth.  Letting go of your breast by himself or herself.  Signs from you:  Breasts that have increased in firmness, weight, and size 1-3 hours after feeding.  Breasts that are softer immediately after breastfeeding.  Increased milk volume, as well as a change in milk consistency and color by the fifth day of breastfeeding.  Nipples that are not sore, cracked, or bleeding.  Signs That Your Baby is Getting Enough Milk  Wetting at least 1-2 diapers during the first 24 hours after birth.  Wetting at least 5-6 diapers every 24 hours for the first week after birth. The urine should be clear or pale yellow by 5 days after birth.  Wetting 6-8 diapers every 24 hours as your baby continues to grow and develop.  At least 3 stools in a 24-hour period by age 5 days. The stool should be soft and yellow.  At least 3 stools in a 24-hour period by age 7 days. The stool should be seedy and yellow.  No loss of weight greater than 10% of birth weight during the first 3 days of age.  Average  weight gain of 4-7 ounces (113-198 g) per week after age 4 days.  Consistent daily weight gain by age 5 days, without weight loss after the age of 2 weeks.  After a feeding, your baby may spit up a small amount. This is common. Breastfeeding frequency and duration Frequent feeding will help you make more milk and can prevent sore nipples and breast engorgement. Breastfeed when you feel the need to reduce the fullness of your breasts or when your baby shows signs of hunger. This is called "breastfeeding on demand." Avoid introducing a pacifier to your baby while you are working to establish breastfeeding (the first 4-6 weeks after your baby is born). After this time you may choose to use a pacifier. Research has shown that pacifier use during the first year of a baby's life decreases the risk of sudden infant death syndrome (SIDS). Allow your baby to feed on each breast as long as he or she wants. Breastfeed until your baby is finished feeding. When your baby unlatches or falls asleep while feeding from the first breast, offer the second breast. Because newborns are often sleepy in the first few weeks of life, you may need to awaken your baby to get him or her to feed. Breastfeeding times will vary from baby to baby. However, the following rules can serve as a guide to help you ensure that your baby is properly fed:  Newborns (babies 4 weeks of age or younger) may breastfeed every 1-3 hours.  Newborns should not go longer than 3 hours during the day or 5 hours during the night without breastfeeding.  You should breastfeed your baby a minimum of 8 times in a 24-hour period until you begin to introduce solid foods to your   baby at around 6 months of age.  Breast milk pumping Pumping and storing breast milk allows you to ensure that your baby is exclusively fed your breast milk, even at times when you are unable to breastfeed. This is especially important if you are going back to work while you are still  breastfeeding or when you are not able to be present during feedings. Your lactation consultant can give you guidelines on how long it is safe to store breast milk. A breast pump is a machine that allows you to pump milk from your breast into a sterile bottle. The pumped breast milk can then be stored in a refrigerator or freezer. Some breast pumps are operated by hand, while others use electricity. Ask your lactation consultant which type will work best for you. Breast pumps can be purchased, but some hospitals and breastfeeding support groups lease breast pumps on a monthly basis. A lactation consultant can teach you how to hand express breast milk, if you prefer not to use a pump. Caring for your breasts while you breastfeed Nipples can become dry, cracked, and sore while breastfeeding. The following recommendations can help keep your breasts moisturized and healthy:  Avoid using soap on your nipples.  Wear a supportive bra. Although not required, special nursing bras and tank tops are designed to allow access to your breasts for breastfeeding without taking off your entire bra or top. Avoid wearing underwire-style bras or extremely tight bras.  Air dry your nipples for 3-4minutes after each feeding.  Use only cotton bra pads to absorb leaked breast milk. Leaking of breast milk between feedings is normal.  Use lanolin on your nipples after breastfeeding. Lanolin helps to maintain your skin's normal moisture barrier. If you use pure lanolin, you do not need to wash it off before feeding your baby again. Pure lanolin is not toxic to your baby. You may also hand express a few drops of breast milk and gently massage that milk into your nipples and allow the milk to air dry.  In the first few weeks after giving birth, some women experience extremely full breasts (engorgement). Engorgement can make your breasts feel heavy, warm, and tender to the touch. Engorgement peaks within 3-5 days after you give  birth. The following recommendations can help ease engorgement:  Completely empty your breasts while breastfeeding or pumping. You may want to start by applying warm, moist heat (in the shower or with warm water-soaked hand towels) just before feeding or pumping. This increases circulation and helps the milk flow. If your baby does not completely empty your breasts while breastfeeding, pump any extra milk after he or she is finished.  Wear a snug bra (nursing or regular) or tank top for 1-2 days to signal your body to slightly decrease milk production.  Apply ice packs to your breasts, unless this is too uncomfortable for you.  Make sure that your baby is latched on and positioned properly while breastfeeding.  If engorgement persists after 48 hours of following these recommendations, contact your health care provider or a lactation consultant. Overall health care recommendations while breastfeeding  Eat healthy foods. Alternate between meals and snacks, eating 3 of each per day. Because what you eat affects your breast milk, some of the foods may make your baby more irritable than usual. Avoid eating these foods if you are sure that they are negatively affecting your baby.  Drink milk, fruit juice, and water to satisfy your thirst (about 10 glasses a day).    Rest often, relax, and continue to take your prenatal vitamins to prevent fatigue, stress, and anemia.  Continue breast self-awareness checks.  Avoid chewing and smoking tobacco. Chemicals from cigarettes that pass into breast milk and exposure to secondhand smoke may harm your baby.  Avoid alcohol and drug use, including marijuana. Some medicines that may be harmful to your baby can pass through breast milk. It is important to ask your health care provider before taking any medicine, including all over-the-counter and prescription medicine as well as vitamin and herbal supplements. It is possible to become pregnant while breastfeeding.  If birth control is desired, ask your health care provider about options that will be safe for your baby. Contact a health care provider if:  You feel like you want to stop breastfeeding or have become frustrated with breastfeeding.  You have painful breasts or nipples.  Your nipples are cracked or bleeding.  Your breasts are red, tender, or warm.  You have a swollen area on either breast.  You have a fever or chills.  You have nausea or vomiting.  You have drainage other than breast milk from your nipples.  Your breasts do not become full before feedings by the fifth day after you give birth.  You feel sad and depressed.  Your baby is too sleepy to eat well.  Your baby is having trouble sleeping.  Your baby is wetting less than 3 diapers in a 24-hour period.  Your baby has less than 3 stools in a 24-hour period.  Your baby's skin or the white part of his or her eyes becomes yellow.  Your baby is not gaining weight by 5 days of age. Get help right away if:  Your baby is overly tired (lethargic) and does not want to wake up and feed.  Your baby develops an unexplained fever. This information is not intended to replace advice given to you by your health care provider. Make sure you discuss any questions you have with your health care provider. Document Released: 11/13/2005 Document Revised: 04/26/2016 Document Reviewed: 05/07/2013 [employer] Interactive Patient Education  2017 [employer]  

## 2017-08-28 NOTE — Progress Notes (Signed)
Increased NPH to 15 units qhs - starting today

## 2017-08-29 NOTE — Progress Notes (Signed)
   PRENATAL VISIT NOTE  Subjective:  Haley [name] is a 26 y.o. G1P0000 at [redacted]w[redacted]d being seen today for ongoing prenatal care.  She is currently monitored for the following issues for this high-risk pregnancy and has Supervision of high-risk pregnancy; Obesity in pregnancy, antepartum; Obesity (BMI 30-39.9); DM (diabetes mellitus), type 2 (HCC); Alpha thalassemia trait; GBS bacteriuria; and Preexisting diabetes complicating pregnancy, antepartum on her problem list.  Patient reports no complaints.  Contractions: Not present. Vag. Bleeding: None.  Movement: Present. Denies leaking of fluid.   The following portions of the patient's history were reviewed and updated as appropriate: allergies, current medications, past family history, past medical history, past social history, past surgical history and problem list. Problem list updated.  Objective:   Vitals:   08/28/17 0937  BP: 109/74  Pulse: 86  Weight: 193 lb (87.5 kg)    Fetal Status: Fetal Heart Rate (bpm): 150   Movement: Present     General:  Alert, oriented and cooperative. Patient is in no acute distress.  Skin: Skin is warm and dry. No rash noted.   Cardiovascular: Normal heart rate noted  Respiratory: Normal respiratory effort, no problems with respiration noted  Abdomen: Soft, gravid, appropriate for gestational age.  Pain/Pressure: Absent     Pelvic: Cervical exam deferred        Extremities: Normal range of motion.  Edema: None  Mental Status:  Normal mood and affect. Normal behavior. Normal judgment and thought content.   Assessment and Plan:  Pregnancy: G1P0000 at [redacted]w[redacted]d  1. Supervision of high risk pregnancy in second trimester Continue prenatal care.   2. Preexisting diabetes complicating pregnancy, antepartum Poorly controlled, especially fasting Please continue diet--add protein to all meals and snacks, add exercise Insulin adjustments--tid novolog and increased NPH Continue ASA  - insulin NPH Human (HUMULIN  N,NOVOLIN N) 100 UNIT/ML injection; Administer 20 units with breakfast and 20 units at bedtime subcutaneously  Dispense: 10 mL; Refill: 3 - insulin aspart (NOVOLOG) 100 UNIT/ML injection; Administer 15 units with breakfast and 5 units with lunch 10 units with dinner subcutaneously  Dispense: 10 mL; Refill: 12 - [place] MFM OB FOLLOW UP; Future  Preterm labor symptoms and general obstetric precautions including but not limited to vaginal bleeding, contractions, leaking of fluid and fetal movement were reviewed in detail with the patient. Please refer to After Visit Summary for other counseling recommendations.  Return in 2 weeks (on 09/11/2017).   Reva Bores, MD

## 2017-09-04 ENCOUNTER — Ambulatory Visit (INDEPENDENT_AMBULATORY_CARE_PROVIDER_SITE_OTHER): Payer: Medicaid Other | Admitting: Obstetrics & Gynecology

## 2017-09-04 VITALS — BP 109/75 | HR 90 | Wt 195.0 lb

## 2017-09-04 DIAGNOSIS — O24319 Unspecified pre-existing diabetes mellitus in pregnancy, unspecified trimester: Secondary | ICD-10-CM

## 2017-09-04 DIAGNOSIS — O24312 Unspecified pre-existing diabetes mellitus in pregnancy, second trimester: Secondary | ICD-10-CM

## 2017-09-04 DIAGNOSIS — O0992 Supervision of high risk pregnancy, unspecified, second trimester: Secondary | ICD-10-CM

## 2017-09-04 MED ORDER — INSULIN NPH (HUMAN) (ISOPHANE) 100 UNIT/ML ~~LOC~~ SUSP: SUBCUTANEOUS | 3 refills | Status: DC

## 2017-09-04 MED ORDER — INSULIN ASPART 100 UNIT/ML ~~LOC~~ SOLN: SUBCUTANEOUS | 12 refills | Status: DC

## 2017-09-04 NOTE — Progress Notes (Signed)
   PRENATAL VISIT NOTE  Subjective:  Haley [name] is a 26 y.o. G1P0000 at [redacted]w[redacted]d being seen today for ongoing prenatal care.  She is currently monitored for the following issues for this high-risk pregnancy and has Supervision of high-risk pregnancy; Obesity in pregnancy, antepartum; Obesity (BMI 30-39.9); DM (diabetes mellitus), type 2 (HCC); Alpha thalassemia trait; GBS bacteriuria; and Preexisting diabetes complicating pregnancy, antepartum on her problem list.  Patient reports no complaints.  Contractions: Not present.  .  Movement: Present. Denies leaking of fluid.   The following portions of the patient's history were reviewed and updated as appropriate: allergies, current medications, past family history, past medical history, past social history, past surgical history and problem list. Problem list updated.  Objective:   Vitals:   09/04/17 0930  BP: 109/75  Pulse: 90  Weight: 195 lb (88.5 kg)    Fetal Status: Fetal Heart Rate (bpm): 141   Movement: Present     General:  Alert, oriented and cooperative. Patient is in no acute distress.  Skin: Skin is warm and dry. No rash noted.   Cardiovascular: Normal heart rate noted  Respiratory: Normal respiratory effort, no problems with respiration noted  Abdomen: Soft, gravid, appropriate for gestational age.  Pain/Pressure: Absent     Pelvic: Cervical exam deferred        Extremities: Normal range of motion.  Edema: None  Mental Status:  Normal mood and affect. Normal behavior. Normal judgment and thought content.          Assessment and Plan:  Pregnancy: G1P0000 at [redacted]w[redacted]d  1. Preexisting diabetes complicating pregnancy, antepartum Sugars reviewed and the following changes made:  Lunch Novolog increased to 10 units. Bedtime NPH increased to 25 units. Told to increase to 30 units if no change after 3-4 days. Hypoglycemia precautions advised.  Growth scan on 10/11.   - insulin NPH Human (HUMULIN N,NOVOLIN N) 100 UNIT/ML  injection; Administer 20 units with breakfast and 25 units at bedtime subcutaneously  Dispense: 10 mL; Refill: 3 - insulin aspart (NOVOLOG) 100 UNIT/ML injection; Administer 15 units with breakfast and 10 units with lunch and 10 units with dinner subcutaneously  Dispense: 10 mL; Refill: 12  2. Supervision of high risk pregnancy in second trimester Preterm labor symptoms and general obstetric precautions including but not limited to vaginal bleeding, contractions, leaking of fluid and fetal movement were reviewed in detail with the patient. Please refer to After Visit Summary for other counseling recommendations.  Return in about 2 weeks (around 09/18/2017) for OB Visit.   Jaynie Collins, MD

## 2017-09-04 NOTE — Patient Instructions (Signed)
Return to clinic for any scheduled appointments or obstetric concerns, or go to [facility] for evaluation  

## 2017-09-06 ENCOUNTER — Ambulatory Visit (HOSPITAL_COMMUNITY)
Admission: RE | Admit: 2017-09-06 | Discharge: 2017-09-06 | Disposition: A | Discharge status: Home / Self Care | Payer: Medicaid Other | Source: Ambulatory Visit | Attending: Family Medicine | Admitting: Family Medicine

## 2017-09-06 DIAGNOSIS — D563 Thalassemia minor: Secondary | ICD-10-CM | POA: Diagnosis not present

## 2017-09-06 DIAGNOSIS — O24312 Unspecified pre-existing diabetes mellitus in pregnancy, second trimester: Secondary | ICD-10-CM | POA: Insufficient documentation

## 2017-09-06 DIAGNOSIS — O99012 Anemia complicating pregnancy, second trimester: Secondary | ICD-10-CM | POA: Insufficient documentation

## 2017-09-06 DIAGNOSIS — Z3A25 25 weeks gestation of pregnancy: Secondary | ICD-10-CM | POA: Diagnosis not present

## 2017-09-06 DIAGNOSIS — O24319 Unspecified pre-existing diabetes mellitus in pregnancy, unspecified trimester: Secondary | ICD-10-CM

## 2017-09-07 ENCOUNTER — Telehealth: Payer: Self-pay | Admitting: *Deleted

## 2017-09-07 NOTE — Telephone Encounter (Signed)
Received call from [facility] stating pt had critical glucose reading. The person calling was not able to tell me what the reading was, nor did she have a phone number to call pt to follow up.   Logged into [facility] and reviewed CBG's. Called pt and LM for her to rtn call. Dr Penne Lash aware and will continue to try to reach pt throughout the afternoon.   CBG readings in [facility]:

## 2017-09-12 ENCOUNTER — Telehealth: Payer: Self-pay

## 2017-09-12 NOTE — Telephone Encounter (Signed)
Received a high glucose reading from [facility] regarding patient blood sugar reading. Reading this am was 150. Called patient-no answer wanted  to make sure she was ok and to see if she had eaten and taken her medications this am.

## 2017-09-18 ENCOUNTER — Ambulatory Visit (INDEPENDENT_AMBULATORY_CARE_PROVIDER_SITE_OTHER): Payer: Medicaid Other | Admitting: Obstetrics & Gynecology

## 2017-09-18 VITALS — BP 121/81 | HR 90 | Wt 200.6 lb

## 2017-09-18 DIAGNOSIS — O24319 Unspecified pre-existing diabetes mellitus in pregnancy, unspecified trimester: Secondary | ICD-10-CM

## 2017-09-18 DIAGNOSIS — O24312 Unspecified pre-existing diabetes mellitus in pregnancy, second trimester: Secondary | ICD-10-CM

## 2017-09-18 DIAGNOSIS — O0992 Supervision of high risk pregnancy, unspecified, second trimester: Secondary | ICD-10-CM

## 2017-09-18 MED ORDER — INSULIN NPH (HUMAN) (ISOPHANE) 100 UNIT/ML ~~LOC~~ SUSP: SUBCUTANEOUS | 3 refills | Status: DC

## 2017-09-18 NOTE — Patient Instructions (Signed)
Return to clinic for any scheduled appointments or obstetric concerns, or go to [facility] for evaluation  

## 2017-09-18 NOTE — Progress Notes (Signed)
PRENATAL VISIT NOTE  Subjective:  Haley [name] is a 26 y.o. G1P0000 at [redacted]w[redacted]d being seen today for ongoing prenatal care.  She is currently monitored for the following issues for this high-risk pregnancy and has Supervision of high-risk pregnancy; Obesity in pregnancy, antepartum; Obesity (BMI 30-39.9); DM (diabetes mellitus), type 2 (HCC); Alpha thalassemia trait; GBS bacteriuria; and Preexisting diabetes complicating pregnancy, antepartum on her problem list.  Patient reports no complaints.  Contractions: Irregular. Vag. Bleeding: None.  Movement: Present. Denies leaking of fluid.   The following portions of the patient's history were reviewed and updated as appropriate: allergies, current medications, past family history, past medical history, past social history, past surgical history and problem list. Problem list updated.  Objective:   Vitals:   09/18/17 1028  BP: 121/81  Pulse: 90  Weight: 200 lb 9.6 oz (91 kg)    Fetal Status: Fetal Heart Rate (bpm): 145   Movement: Present     General:  Alert, oriented and cooperative. Patient is in no acute distress.  Skin: Skin is warm and dry. No rash noted.   Cardiovascular: Normal heart rate noted  Respiratory: Normal respiratory effort, no problems with respiration noted  Abdomen: Soft, gravid, appropriate for gestational age.  Pain/Pressure: Absent     Pelvic: Cervical exam deferred        Extremities: Normal range of motion.  Edema: None  Mental Status:  Normal mood and affect. Normal behavior. Normal judgment and thought content.  Week of 2017/09/03   FASTING BREAKFAST LUNCH DINNER  MON OCT 08  Show notes 113  149       TUE OCT 09  Show notes 123  124  117  108   WED OCT 10  Show notes 100  67       THU OCT 11  Show notes 113  85  122     FRI OCT 12  Show notes 154  121  96    SAT OCT 13 Show notes 112     No values on 09/09/17 Week of 2017/09/10   FASTING BREAKFAST LUNCH DINNER  MON OCT 15  Show notes  117  124  105     TUE OCT 16  Show notes          WED OCT 17  Show notes 152         THU OCT 18  Show notes 109  132  102     FRI OCT 19  Show notes 148  125       SAT OCT 20  Show notes 131  122  106  118   SUN OCT 21  Show notes 98          [place] Mfm Ob Follow Up  Result Date: 09/06/2017 ----------------------------------------------------------------------  OBSTETRICS REPORT                      (Signed Final 09/06/2017 01:04 pm) ---------------------------------------------------------------------- Patient Info  ID #:       [id number]                          D.O.B.:  [birth year] (26 yrs)  Name:       Haley [name]                    Visit Date: 09/06/2017 11:43 am ---------------------------------------------------------------------- Performed By  Performed By:     Hurman Horn  Ref. Address:     [address]                    [address]                                                             [address]                                                             [place], [place]                                                             [id number]  Attending:        Charlsie MerlesMark Newman MD         [facility]  Referred By:      Tereso NewcomerUGONNA A                    ANYANWU MD ---------------------------------------------------------------------- Orders   #  Description                                 Code   1  US MFM OB FOLLOW UP                         [id number]  ----------------------------------------------------------------------   #  Ordered By               Order #        Accession #    Episode #   1  Tinnie GensANYA PRATT              [id number]      [id number]     [id number]  ---------------------------------------------------------------------- Indications   [redacted] weeks gestation of pregnancy                Z3A.25   Obesity complicating pregnancy, second         O99.212   trimester   Maternal thalassemia complicating              O99.012   pregnancy in second trimester (alpha    thalassemia trait)   Pre-existing diabetes, type 2, in pregnancy,   O24.112   second trimester (insulin, metformin)  ---------------------------------------------------------------------- OB History  Blood Type:            Height:  5'2"   Weight (lb):  187       BMI:  34.2  Gravidity:    1 ---------------------------------------------------------------------- Fetal Evaluation  Num Of Fetuses:     1  Fetal Heart         151  Rate(bpm):  Cardiac Activity:   Observed  Presentation:       Cephalic  Placenta:  Posterior, above cervical os  P. Cord Insertion:  Previously Visualized  Amniotic Fluid  AFI FV:      Subjectively within normal limits                              Largest Pocket(cm)                              5.91 ---------------------------------------------------------------------- Biometry  BPD:      64.8  mm     G. Age:  26w 2d         77  %    CI:        76.06   %    70 - 86                                                          FL/HC:      19.0   %    18.7 - 20.3  HC:      235.5  mm     G. Age:  25w 4d         45  %    HC/AC:      1.11        1.04 - 1.22  AC:      211.4  mm     G. Age:  25w 5d         57  %    FL/BPD:     69.1   %    71 - 87  FL:       44.8  mm     G. Age:  24w 6d         26  %    FL/AC:      21.2   %    20 - 24  Est. FW:     802  gm    1 lb 12 oz      58  % ---------------------------------------------------------------------- Gestational Age  LMP:           25w 1d        Date:  03/14/17                 EDD:   12/19/17  U/S Today:     25w 4d                                        EDD:   12/16/17  Best:          25w 1d     Det. By:  LMP  (03/14/17)          EDD:   12/19/17 ---------------------------------------------------------------------- Anatomy  Cranium:               Appears normal         Aortic Arch:            Previously seen  Cavum:                 Appears normal         Ductal Arch:  Previously seen  Ventricles:            Previously seen        Diaphragm:               Appears normal  Choroid Plexus:        Previously seen        Stomach:                Appears normal, left                                                                        sided  Cerebellum:            Previously seen        Abdomen:                Appears normal  Posterior Fossa:       Previously seen        Abdominal Wall:         Appears nml (cord                                                                        insert, abd wall)  Nuchal Fold:           Previously seen        Cord Vessels:           Appears normal (3                                                                        vessel cord)  Face:                  Orbits and profile     Kidneys:                Appear normal                         previously seen  Lips:                  Appears normal         Bladder:                Appears normal  Thoracic:              Appears normal         Spine:                  Previously seen  Heart:                 Appears normal         Upper Extremities:  Previously seen                         (4CH, axis, and situs  RVOT:                  Appears normal         Lower Extremities:      Previously seen  LVOT:                  Appears normal  Other:  Fetus appears to be a female. Parents do not wish to know sex of          fetus.LT Heel prev visualized. Technically difficult due to fetal position. ---------------------------------------------------------------------- Cervix Uterus Adnexa  Cervix  Length:           4.77  cm.  Normal appearance by transabdominal scan.  Uterus  No abnormality visualized.  Left Ovary  Not visualized.  Right Ovary  Not visualized.  Adnexa:       No abnormality visualized. ---------------------------------------------------------------------- Impression  Singleton intrauterine pregnancy at 25+1 weeks, here for  growth evaluation. Patient has alpha thalassemia trait and  type 2 diabetes  Interval review of the anatomy shows no sonographic  markers for  aneuploidy or structural anomalies. All relevant  anatomy has been visualized  Amniotic fluid volume is normal  Estimated fetal weight is 802g which is growth in the 58th  percentile ---------------------------------------------------------------------- Recommendations  Recommend evaluations every 4 weeks due to DM, and  antepartum testing from 30-32 weeks ----------------------------------------------------------------------                 Charlsie Merles, MD Electronically Signed Final Report   09/06/2017 01:04 pm ----------------------------------------------------------------------    Assessment and Plan:  Pregnancy: G1P0000 at [redacted]w[redacted]d  1. Preexisting diabetes complicating pregnancy, antepartum Still battling with fasting CBG coverage, will increase nighttime NPH to 30 units. Continue rest of regimen and metformin. Growth scan ordered, has normal growth on 09/06/17. Antenatal testing to start at 32 weeks. - insulin NPH Human (HUMULIN N,NOVOLIN N) 100 UNIT/ML injection; Administer 20 units with breakfast and 30 units at bedtime subcutaneously  Dispense: 10 mL; Refill: 3 - [place] MFM OB FOLLOW UP; Future  2. Supervision of high risk pregnancy in second trimester Preterm labor symptoms and general obstetric precautions including but not limited to vaginal bleeding, contractions, leaking of fluid and fetal movement were reviewed in detail with the patient. Please refer to After Visit Summary for other counseling recommendations.  Return in about 2 weeks (around 10/02/2017) for 3rd trimester labs, TDap, OB Visit.   Jaynie Collins, MD

## 2017-09-24 NOTE — Telephone Encounter (Signed)
Pt came in and saw Dr. Macon LargeAnyanwu who adjusted medication.

## 2017-09-25 ENCOUNTER — Ambulatory Visit (HOSPITAL_COMMUNITY): Payer: Medicaid Other

## 2017-10-02 ENCOUNTER — Other Ambulatory Visit: Payer: Self-pay | Admitting: Obstetrics & Gynecology

## 2017-10-02 ENCOUNTER — Ambulatory Visit (HOSPITAL_COMMUNITY)
Admission: RE | Admit: 2017-10-02 | Discharge: 2017-10-02 | Disposition: A | Discharge status: Home / Self Care | Payer: Medicaid Other | Source: Ambulatory Visit | Attending: Obstetrics & Gynecology | Admitting: Obstetrics & Gynecology

## 2017-10-02 ENCOUNTER — Ambulatory Visit (INDEPENDENT_AMBULATORY_CARE_PROVIDER_SITE_OTHER): Payer: Medicaid Other | Admitting: Obstetrics & Gynecology

## 2017-10-02 VITALS — BP 109/73 | HR 91 | Wt 215.2 lb

## 2017-10-02 DIAGNOSIS — O99013 Anemia complicating pregnancy, third trimester: Secondary | ICD-10-CM | POA: Diagnosis present

## 2017-10-02 DIAGNOSIS — O99210 Obesity complicating pregnancy, unspecified trimester: Secondary | ICD-10-CM

## 2017-10-02 DIAGNOSIS — D569 Thalassemia, unspecified: Secondary | ICD-10-CM

## 2017-10-02 DIAGNOSIS — O24319 Unspecified pre-existing diabetes mellitus in pregnancy, unspecified trimester: Secondary | ICD-10-CM

## 2017-10-02 DIAGNOSIS — Z3A28 28 weeks gestation of pregnancy: Secondary | ICD-10-CM

## 2017-10-02 DIAGNOSIS — O88219 Thromboembolism in pregnancy, unspecified trimester: Secondary | ICD-10-CM

## 2017-10-02 DIAGNOSIS — R8271 Bacteriuria: Secondary | ICD-10-CM

## 2017-10-02 DIAGNOSIS — O0993 Supervision of high risk pregnancy, unspecified, third trimester: Secondary | ICD-10-CM

## 2017-10-02 LAB — CBC
HEMATOCRIT: 36.3 % (ref 34.0–46.6)
Hemoglobin: 11.7 g/dL (ref 11.1–15.9)
MCH: 24.3 pg — AB (ref 26.6–33.0)
MCHC: 32.2 g/dL (ref 31.5–35.7)
MCV: 76 fL — AB (ref 79–97)
Platelets: 203 10*3/uL (ref 150–379)
RBC: 4.81 x10E6/uL (ref 3.77–5.28)
RDW: 15.9 % — AB (ref 12.3–15.4)
WBC: 10.1 10*3/uL (ref 3.4–10.8)

## 2017-10-02 NOTE — Progress Notes (Signed)
Declined tdap at this time Will get her flu shot at the pharmacy

## 2017-10-02 NOTE — Progress Notes (Signed)
   PRENATAL VISIT NOTE  Subjective:  Haley [name] is a 26 y.o. G1P0000 at 6441w6d being seen today for ongoing prenatal care.  She is currently monitored for the following issues for this high-risk pregnancy and has Supervision of high-risk pregnancy; Obesity in pregnancy, antepartum; Obesity (BMI 30-39.9); DM (diabetes mellitus), type 2 (HCC); Alpha thalassemia trait; GBS bacteriuria; and Preexisting diabetes complicating pregnancy, antepartum on their problem list.  Patient reports no complaints.  Contractions: Not present.  .  Movement: Present. Denies leaking of fluid.   The following portions of the patient's history were reviewed and updated as appropriate: allergies, current medications, past family history, past medical history, past social history, past surgical history and problem list. Problem list updated.  Objective:   Vitals:   10/02/17 0927  BP: 109/73  Pulse: 91  Weight: 215 lb 3.2 oz (97.6 kg)    Fetal Status: Fetal Heart Rate (bpm): 142   Movement: Present     General:  Alert, oriented and cooperative. Patient is in no acute distress.  Skin: Skin is warm and dry. No rash noted.   Cardiovascular: Normal heart rate noted  Respiratory: Normal respiratory effort, no problems with respiration noted  Abdomen: Soft, gravid, appropriate for gestational age.  Pain/Pressure: Absent     Pelvic: Cervical exam deferred        Extremities: Normal range of motion.  Edema: None  Mental Status:  Normal mood and affect. Normal behavior. Normal judgment and thought content.   Assessment and Plan:  Pregnancy: G1P0000 at 8041w6d  1. Supervision of high risk pregnancy in third trimester   2. Preexisting diabetes complicating pregnancy, antepartum - I will increase her qhs insulin to 34 from 30 due to fbs in the 120-150 range  3. Obesity in pregnancy, antepartum - She has a follow up u/s with MFM today  4. GBS bacteriuria - treat in labor  Preterm labor symptoms and general  obstetric precautions including but not limited to vaginal bleeding, contractions, leaking of fluid and fetal movement were reviewed in detail with the patient. Please refer to After Visit Summary for other counseling recommendations.  No Follow-up on file.   Allie BossierMyra C Markisha Meding, MD

## 2017-10-03 LAB — HIV ANTIBODY (ROUTINE TESTING W REFLEX): HIV SCREEN 4TH GENERATION: NONREACTIVE

## 2017-10-03 LAB — RPR: RPR: NONREACTIVE

## 2017-10-12 ENCOUNTER — Ambulatory Visit (INDEPENDENT_AMBULATORY_CARE_PROVIDER_SITE_OTHER): Payer: Medicaid Other | Admitting: Obstetrics & Gynecology

## 2017-10-12 VITALS — BP 121/74 | HR 71 | Wt 209.4 lb

## 2017-10-12 DIAGNOSIS — O0993 Supervision of high risk pregnancy, unspecified, third trimester: Secondary | ICD-10-CM

## 2017-10-12 DIAGNOSIS — E669 Obesity, unspecified: Secondary | ICD-10-CM

## 2017-10-12 DIAGNOSIS — O24319 Unspecified pre-existing diabetes mellitus in pregnancy, unspecified trimester: Secondary | ICD-10-CM

## 2017-10-12 DIAGNOSIS — R8271 Bacteriuria: Secondary | ICD-10-CM

## 2017-10-12 NOTE — Progress Notes (Signed)
   PRENATAL VISIT NOTE  Subjective:  Haley [name] is a 26 y.o. G1P0000 at 647w2d being seen today for ongoing prenatal care.  She is currently monitored for the following issues for this high-risk pregnancy and has Supervision of high-risk pregnancy; Obesity in pregnancy, antepartum; Obesity (BMI 30-39.9); DM (diabetes mellitus), type 2 (HCC); Alpha thalassemia trait; GBS bacteriuria; and Preexisting diabetes complicating pregnancy, antepartum on their problem list.  Patient reports no complaints.   .  .   . Denies leaking of fluid.   The following portions of the patient's history were reviewed and updated as appropriate: allergies, current medications, past family history, past medical history, past social history, past surgical history and problem list. Problem list updated.  Objective:  There were no vitals filed for this visit.  Fetal Status:           General:  Alert, oriented and cooperative. Patient is in no acute distress.  Skin: Skin is warm and dry. No rash noted.   Cardiovascular: Normal heart rate noted  Respiratory: Normal respiratory effort, no problems with respiration noted  Abdomen: Soft, gravid, appropriate for gestational age.        Pelvic: Cervical exam deferred        Extremities: Normal range of motion.     Mental Status:  Normal mood and affect. Normal behavior. Normal judgment and thought content.   Assessment and Plan:  Pregnancy: G1P0000 at 7347w2d  1. Supervision of high risk pregnancy in third trimester   2. Preexisting diabetes complicating pregnancy, antepartum - her fasting blood sugars are still high, 120s. I will have her increase her qhs insulin from 34 to 38.   3. Obesity (BMI 30-39.9)   4. GBS bacteriuria - treat in labor  Preterm labor symptoms and general obstetric precautions including but not limited to vaginal bleeding, contractions, leaking of fluid and fetal movement were reviewed in detail with the patient. Please refer to After Visit  Summary for other counseling recommendations.  No Follow-up on file.   Allie BossierMyra C Lawton Dollinger, MD

## 2017-10-17 ENCOUNTER — Other Ambulatory Visit: Payer: Self-pay

## 2017-10-17 DIAGNOSIS — E118 Type 2 diabetes mellitus with unspecified complications: Secondary | ICD-10-CM

## 2017-10-17 MED ORDER — METFORMIN HCL 1000 MG PO TABS: 1000.0000 mg | ORAL_TABLET | Freq: Two times a day (BID) | ORAL | 3 refills | Status: DC

## 2017-10-19 ENCOUNTER — Other Ambulatory Visit: Payer: Self-pay | Admitting: Obstetrics & Gynecology

## 2017-10-19 ENCOUNTER — Ambulatory Visit (HOSPITAL_COMMUNITY)
Admission: RE | Admit: 2017-10-19 | Discharge: 2017-10-19 | Disposition: A | Discharge status: Home / Self Care | Payer: Medicaid Other | Source: Ambulatory Visit | Attending: Obstetrics & Gynecology | Admitting: Obstetrics & Gynecology

## 2017-10-19 DIAGNOSIS — O99013 Anemia complicating pregnancy, third trimester: Secondary | ICD-10-CM | POA: Diagnosis not present

## 2017-10-19 DIAGNOSIS — O99210 Obesity complicating pregnancy, unspecified trimester: Secondary | ICD-10-CM

## 2017-10-19 DIAGNOSIS — O24319 Unspecified pre-existing diabetes mellitus in pregnancy, unspecified trimester: Secondary | ICD-10-CM

## 2017-10-19 DIAGNOSIS — Z3A31 31 weeks gestation of pregnancy: Secondary | ICD-10-CM

## 2017-10-19 DIAGNOSIS — O24113 Pre-existing diabetes mellitus, type 2, in pregnancy, third trimester: Secondary | ICD-10-CM | POA: Diagnosis not present

## 2017-10-23 ENCOUNTER — Ambulatory Visit (INDEPENDENT_AMBULATORY_CARE_PROVIDER_SITE_OTHER): Payer: Medicaid Other | Admitting: Obstetrics & Gynecology

## 2017-10-23 VITALS — BP 108/70 | HR 87 | Wt 210.2 lb

## 2017-10-23 DIAGNOSIS — O0992 Supervision of high risk pregnancy, unspecified, second trimester: Secondary | ICD-10-CM

## 2017-10-23 DIAGNOSIS — E118 Type 2 diabetes mellitus with unspecified complications: Secondary | ICD-10-CM

## 2017-10-23 DIAGNOSIS — R8271 Bacteriuria: Secondary | ICD-10-CM

## 2017-10-23 DIAGNOSIS — O99210 Obesity complicating pregnancy, unspecified trimester: Secondary | ICD-10-CM

## 2017-10-23 DIAGNOSIS — D563 Thalassemia minor: Secondary | ICD-10-CM

## 2017-10-23 DIAGNOSIS — O24319 Unspecified pre-existing diabetes mellitus in pregnancy, unspecified trimester: Secondary | ICD-10-CM

## 2017-10-23 MED ORDER — INSULIN NPH (HUMAN) (ISOPHANE) 100 UNIT/ML ~~LOC~~ SUSP: SUBCUTANEOUS | 3 refills | Status: DC

## 2017-10-23 MED ORDER — INSULIN ASPART 100 UNIT/ML ~~LOC~~ SOLN: SUBCUTANEOUS | 12 refills | Status: DC

## 2017-10-23 MED ORDER — METFORMIN HCL 1000 MG PO TABS: 1000.0000 mg | ORAL_TABLET | Freq: Two times a day (BID) | ORAL | 3 refills | Status: DC

## 2017-10-23 NOTE — Patient Instructions (Addendum)

## 2017-10-23 NOTE — Progress Notes (Signed)
   PRENATAL VISIT NOTE  Subjective:  Haley [name] is a 26 y.o. G1P0000 at [redacted]w[redacted]d being seen today for ongoing prenatal care.  She is currently monitored for the following issues for this high-risk pregnancy and has Supervision of high-risk pregnancy; Obesity in pregnancy, antepartum; Obesity (BMI 30-39.9); DM (diabetes mellitus), type 2 (HCC); Alpha thalassemia trait; GBS bacteriuria; and Preexisting diabetes complicating pregnancy, antepartum on their problem list.  Patient reports no complaints.  Contractions: Irritability.  .  Movement: Present. Denies leaking of fluid.   The following portions of the patient's history were reviewed and updated as appropriate: allergies, current medications, past family history, past medical history, past social history, past surgical history and problem list. Problem list updated.  Objective:   Vitals:   10/23/17 0925  BP: 108/70  Pulse: 87  Weight: 210 lb 3.2 oz (95.3 kg)    Fetal Status: Fetal Heart Rate (bpm): 135   Movement: Present     General:  Alert, oriented and cooperative. Patient is in no acute distress.  Skin: Skin is warm and dry. No rash noted.   Cardiovascular: Normal heart rate noted  Respiratory: Normal respiratory effort, no problems with respiration noted  Abdomen: Soft, gravid, appropriate for gestational age.  Pain/Pressure: Absent     Pelvic: Cervical exam deferred        Extremities: Normal range of motion.  Edema: None  Mental Status:  Normal mood and affect. Normal behavior. Normal judgment and thought content.   Assessment and Plan:  Pregnancy: G1P0000 at 455w6d  1. Supervision of high risk pregnancy in second trimester  2. Preexisting diabetes complicating pregnancy, antepartum  Pt was not aware of what meds she sghould be taking or what dose. She was not clear on the long vs short acting meds and how they should be taken . I expressed to her my concerns about her elevated fasting glucose.   Last dose of Metformin  was 2 days prev. Pt ran out.   Current updated medications:  Humilin (NPH):  20u am  42u pm (new dose)  Novolog:  15u am  10u lunch 10u duinner  Metformin 1000 twice a day  Impression  Singleton intrauterine pregnancy at 28+6 weeks, here for  growth evaluation. Patient has alpha thalassemia trait and  type 2 diabetes  Interval review of the anatomy shows no sonographic  markers for aneuploidy or structural anomalies. All relevant  anatomy has been visualized  Amniotic fluid volume is normal  Estimated fetal weight is 1468g which is growth in the 71st  percentile; AC is in the 90th percentile  Pt referred to the DM educator for a refresher course   3. Obesity in pregnancy, antepartum  4. GBS bacteriuria Tx in labor  5. Alpha thalassemia trait  Preterm labor symptoms and general obstetric precautions including but not limited to vaginal bleeding, contractions, leaking of fluid and fetal movement were reviewed in detail with the patient. Please refer to After Visit Summary for other counseling recommendations.  Return in about 2 weeks (around 11/06/2017).   Willodean Rosenthalarolyn Harraway-Smith, MD

## 2017-10-23 NOTE — Progress Notes (Signed)
MON NOV 12  Show notes 119  106  142     TUE NOV 13  Show notes 139  112  147  84   WED NOV 14  Show notes 118  136       THU NOV 15  Show notes 113  126  98     FRI NOV 16  Show notes 123         SAT NOV 17  Show notes          SUN NOV 18  Show notes 108  113  125      WED NOV 21  Fasting  141   FRI NOV 23  128   NOV 24 Fasting        Breakfast  109                               126  Nov 25 Fasting   112

## 2017-10-30 ENCOUNTER — Ambulatory Visit (INDEPENDENT_AMBULATORY_CARE_PROVIDER_SITE_OTHER): Payer: Medicaid Other | Admitting: Obstetrics & Gynecology

## 2017-10-30 VITALS — BP 114/74 | HR 74 | Wt 213.0 lb

## 2017-10-30 DIAGNOSIS — O0993 Supervision of high risk pregnancy, unspecified, third trimester: Secondary | ICD-10-CM

## 2017-10-30 DIAGNOSIS — O24313 Unspecified pre-existing diabetes mellitus in pregnancy, third trimester: Secondary | ICD-10-CM

## 2017-10-30 DIAGNOSIS — O24319 Unspecified pre-existing diabetes mellitus in pregnancy, unspecified trimester: Secondary | ICD-10-CM

## 2017-10-30 MED ORDER — INSULIN NPH (HUMAN) (ISOPHANE) 100 UNIT/ML ~~LOC~~ SUSP: SUBCUTANEOUS | 3 refills | Status: DC

## 2017-10-30 NOTE — Patient Instructions (Signed)
Return to clinic for any scheduled appointments or obstetric concerns, or go to [facility] for evaluation  

## 2017-10-30 NOTE — Progress Notes (Signed)
   PRENATAL VISIT NOTE  Subjective:  Haley [name] is a 26 y.o. G1P0000 at 9154w6d being seen today for ongoing prenatal care.  She is currently monitored for the following issues for this high-risk pregnancy and has Supervision of high-risk pregnancy; Obesity in pregnancy, antepartum; Obesity (BMI 30-39.9); DM (diabetes mellitus), type 2 (HCC); Alpha thalassemia trait; GBS bacteriuria; and Preexisting diabetes complicating pregnancy, antepartum on their problem list.  Patient reports no complaints.  Contractions: Irritability.  .  Movement: Present. Denies leaking of fluid.   The following portions of the patient's history were reviewed and updated as appropriate: allergies, current medications, past family history, past medical history, past social history, past surgical history and problem list. Problem list updated.  Objective:   Vitals:   10/30/17 1435  BP: 114/74  Pulse: 74  Weight: 213 lb (96.6 kg)    Fetal Status: Fetal Heart Rate (bpm): 145 Fundal Height: 35 cm Movement: Present     General:  Alert, oriented and cooperative. Patient is in no acute distress.  Skin: Skin is warm and dry. No rash noted.   Cardiovascular: Normal heart rate noted  Respiratory: Normal respiratory effort, no problems with respiration noted  Abdomen: Soft, gravid, appropriate for gestational age.  Pain/Pressure: Absent     Pelvic: Cervical exam deferred        Extremities: Normal range of motion.  Edema: None  Mental Status:  Normal mood and affect. Normal behavior. Normal judgment and thought content.      Assessment and Plan:  Pregnancy: G1P0000 at 6754w6d  1. Preexisting diabetes complicating pregnancy, antepartum Still elevated fasting BS and some elevated PP. Changed insulin regimen.  Current updated medications: NPH 20/48 and Novolog 15/10/10 and Metformin 1000 twice a day NST performed today was reviewed and was found to be reactive.  Continue recommended antenatal testing and prenatal  care. Next growth scan ordered for two weeks.  - US MFM FETAL BPP WO NON STRESS; Future - US MFM OB FOLLOW UP; Future  2. Supervision of high risk pregnancy in third trimester Preterm labor symptoms and general obstetric precautions including but not limited to vaginal bleeding, contractions, leaking of fluid and fetal movement were reviewed in detail with the patient. Please refer to After Visit Summary for other counseling recommendations.  Return in about 3 days (around 11/02/2017) for NST, AFI. 1 week: OB visit and NST.   Jaynie CollinsUgonna Adelina Collard, MD

## 2017-10-30 NOTE — Addendum Note (Signed)
Addended by: Arne ClevelandHUTCHINSON, MANDY J on: 10/30/2017 06:02 PM   Modules accepted: Orders

## 2017-11-02 ENCOUNTER — Other Ambulatory Visit: Payer: Medicaid Other

## 2017-11-05 ENCOUNTER — Other Ambulatory Visit: Payer: Medicaid Other

## 2017-11-06 ENCOUNTER — Other Ambulatory Visit: Payer: Medicaid Other

## 2017-11-07 ENCOUNTER — Ambulatory Visit (INDEPENDENT_AMBULATORY_CARE_PROVIDER_SITE_OTHER): Payer: Medicaid Other | Admitting: *Deleted

## 2017-11-07 ENCOUNTER — Encounter: Payer: Self-pay | Admitting: *Deleted

## 2017-11-07 DIAGNOSIS — E118 Type 2 diabetes mellitus with unspecified complications: Secondary | ICD-10-CM

## 2017-11-07 DIAGNOSIS — O24113 Pre-existing diabetes mellitus, type 2, in pregnancy, third trimester: Secondary | ICD-10-CM | POA: Diagnosis not present

## 2017-11-07 NOTE — Progress Notes (Signed)
nst

## 2017-11-08 ENCOUNTER — Ambulatory Visit (INDEPENDENT_AMBULATORY_CARE_PROVIDER_SITE_OTHER): Payer: Medicaid Other | Admitting: *Deleted

## 2017-11-08 ENCOUNTER — Other Ambulatory Visit (HOSPITAL_COMMUNITY): Payer: Medicaid Other

## 2017-11-08 DIAGNOSIS — O24113 Pre-existing diabetes mellitus, type 2, in pregnancy, third trimester: Secondary | ICD-10-CM

## 2017-11-08 DIAGNOSIS — E118 Type 2 diabetes mellitus with unspecified complications: Secondary | ICD-10-CM

## 2017-11-08 NOTE — Progress Notes (Signed)
NST/AFI today Subjective Polyhydramnios on bedside [place]

## 2017-11-12 ENCOUNTER — Other Ambulatory Visit: Payer: Self-pay

## 2017-11-12 ENCOUNTER — Other Ambulatory Visit: Payer: Medicaid Other

## 2017-11-12 DIAGNOSIS — O24319 Unspecified pre-existing diabetes mellitus in pregnancy, unspecified trimester: Secondary | ICD-10-CM

## 2017-11-12 MED ORDER — INSULIN NPH (HUMAN) (ISOPHANE) 100 UNIT/ML ~~LOC~~ SUSP: SUBCUTANEOUS | 3 refills | Status: DC

## 2017-11-15 ENCOUNTER — Other Ambulatory Visit: Payer: Medicaid Other

## 2017-11-16 ENCOUNTER — Encounter (HOSPITAL_COMMUNITY): Payer: Self-pay

## 2017-11-16 ENCOUNTER — Encounter: Payer: Self-pay | Admitting: Obstetrics & Gynecology

## 2017-11-16 ENCOUNTER — Ambulatory Visit (HOSPITAL_COMMUNITY)
Admission: RE | Admit: 2017-11-16 | Discharge: 2017-11-16 | Disposition: A | Discharge status: Home / Self Care | Payer: Medicaid Other | Source: Ambulatory Visit | Attending: Obstetrics & Gynecology | Admitting: Obstetrics & Gynecology

## 2017-11-16 ENCOUNTER — Ambulatory Visit (INDEPENDENT_AMBULATORY_CARE_PROVIDER_SITE_OTHER): Payer: Medicaid Other | Admitting: Obstetrics & Gynecology

## 2017-11-16 VITALS — BP 127/75 | HR 92 | Wt 226.0 lb

## 2017-11-16 DIAGNOSIS — O24319 Unspecified pre-existing diabetes mellitus in pregnancy, unspecified trimester: Secondary | ICD-10-CM

## 2017-11-16 DIAGNOSIS — O99210 Obesity complicating pregnancy, unspecified trimester: Secondary | ICD-10-CM

## 2017-11-16 DIAGNOSIS — O403XX0 Polyhydramnios, third trimester, not applicable or unspecified: Secondary | ICD-10-CM | POA: Diagnosis not present

## 2017-11-16 DIAGNOSIS — O99113 Other diseases of the blood and blood-forming organs and certain disorders involving the immune mechanism complicating pregnancy, third trimester: Secondary | ICD-10-CM | POA: Diagnosis not present

## 2017-11-16 DIAGNOSIS — Z3A35 35 weeks gestation of pregnancy: Secondary | ICD-10-CM | POA: Diagnosis not present

## 2017-11-16 DIAGNOSIS — O0993 Supervision of high risk pregnancy, unspecified, third trimester: Secondary | ICD-10-CM

## 2017-11-16 DIAGNOSIS — Z794 Long term (current) use of insulin: Secondary | ICD-10-CM | POA: Insufficient documentation

## 2017-11-16 DIAGNOSIS — D563 Thalassemia minor: Secondary | ICD-10-CM | POA: Insufficient documentation

## 2017-11-16 DIAGNOSIS — O24313 Unspecified pre-existing diabetes mellitus in pregnancy, third trimester: Secondary | ICD-10-CM

## 2017-11-16 DIAGNOSIS — Z362 Encounter for other antenatal screening follow-up: Secondary | ICD-10-CM | POA: Insufficient documentation

## 2017-11-16 DIAGNOSIS — O24113 Pre-existing diabetes mellitus, type 2, in pregnancy, third trimester: Secondary | ICD-10-CM | POA: Insufficient documentation

## 2017-11-16 DIAGNOSIS — E669 Obesity, unspecified: Secondary | ICD-10-CM

## 2017-11-16 DIAGNOSIS — E119 Type 2 diabetes mellitus without complications: Secondary | ICD-10-CM | POA: Diagnosis not present

## 2017-11-16 DIAGNOSIS — O99213 Obesity complicating pregnancy, third trimester: Secondary | ICD-10-CM

## 2017-11-16 NOTE — Progress Notes (Signed)
   PRENATAL VISIT NOTE  Subjective:  Haley [name] is a 26 y.o. G1P0000 at 5954w2d being seen today for ongoing prenatal care.  She is currently monitored for the following issues for this high-risk pregnancy and has Supervision of high-risk pregnancy; Obesity in pregnancy, antepartum; Obesity (BMI 30-39.9); DM (diabetes mellitus), type 2 (HCC); Alpha thalassemia trait; GBS bacteriuria; and Preexisting diabetes complicating pregnancy, antepartum on their problem list.  Patient reports no complaints.  Contractions: Irritability. Vag. Bleeding: None.  Movement: Present. Denies leaking of fluid.   The following portions of the patient's history were reviewed and updated as appropriate: allergies, current medications, past family history, past medical history, past social history, past surgical history and problem list. Problem list updated.  Objective:   Vitals:   11/16/17 0922  BP: 127/75  Pulse: 92  Weight: 226 lb (102.5 kg)    Fetal Status: Fetal Heart Rate (bpm): NST   Movement: Present     General:  Alert, oriented and cooperative. Patient is in no acute distress.  Skin: Skin is warm and dry. No rash noted.   Cardiovascular: Normal heart rate noted  Respiratory: Normal respiratory effort, no problems with respiration noted  Abdomen: Soft, gravid, appropriate for gestational age.  Pain/Pressure: Absent     Pelvic: Cervical exam deferred        Extremities: Normal range of motion.  Edema: None  Mental Status:  Normal mood and affect. Normal behavior. Normal judgment and thought content.   Assessment and Plan:  Pregnancy: G1P0000 at 654w2d  1. Supervision of high risk pregnancy in third trimester   2. Obesity in pregnancy, antepartum   3. Pre existing DM- the few postprandial sugars that she recorded are not awful (generally less than 140). Her fastings are all elevated. I have rec'd that she increase her hs NPH from the 50 that she currently takes to 55.  She has a MFM u/s  today  Preterm labor symptoms and general obstetric precautions including but not limited to vaginal bleeding, contractions, leaking of fluid and fetal movement were reviewed in detail with the patient. Please refer to After Visit Summary for other counseling recommendations.  No Follow-up on file.   Allie BossierMyra C Avon Molock, MD

## 2017-11-21 ENCOUNTER — Other Ambulatory Visit (HOSPITAL_COMMUNITY)
Admission: RE | Admit: 2017-11-21 | Discharge: 2017-11-21 | Disposition: A | Discharge status: Home / Self Care | Payer: Medicaid Other | Source: Ambulatory Visit | Attending: Obstetrics and Gynecology | Admitting: Obstetrics and Gynecology

## 2017-11-21 ENCOUNTER — Ambulatory Visit (INDEPENDENT_AMBULATORY_CARE_PROVIDER_SITE_OTHER): Payer: Medicaid Other | Admitting: Obstetrics and Gynecology

## 2017-11-21 VITALS — BP 128/81 | HR 87 | Wt 224.4 lb

## 2017-11-21 DIAGNOSIS — Z3A35 35 weeks gestation of pregnancy: Secondary | ICD-10-CM | POA: Diagnosis not present

## 2017-11-21 DIAGNOSIS — R8271 Bacteriuria: Secondary | ICD-10-CM

## 2017-11-21 DIAGNOSIS — O99213 Obesity complicating pregnancy, third trimester: Secondary | ICD-10-CM

## 2017-11-21 DIAGNOSIS — O0993 Supervision of high risk pregnancy, unspecified, third trimester: Secondary | ICD-10-CM

## 2017-11-21 DIAGNOSIS — O24319 Unspecified pre-existing diabetes mellitus in pregnancy, unspecified trimester: Secondary | ICD-10-CM

## 2017-11-21 DIAGNOSIS — E669 Obesity, unspecified: Secondary | ICD-10-CM

## 2017-11-21 DIAGNOSIS — O403XX0 Polyhydramnios, third trimester, not applicable or unspecified: Secondary | ICD-10-CM | POA: Insufficient documentation

## 2017-11-21 DIAGNOSIS — O99210 Obesity complicating pregnancy, unspecified trimester: Secondary | ICD-10-CM

## 2017-11-21 DIAGNOSIS — O24313 Unspecified pre-existing diabetes mellitus in pregnancy, third trimester: Secondary | ICD-10-CM

## 2017-11-21 MED ORDER — INSULIN NPH (HUMAN) (ISOPHANE) 100 UNIT/ML ~~LOC~~ SUSP: SUBCUTANEOUS | 3 refills | Status: DC

## 2017-11-21 MED ORDER — INSULIN ASPART 100 UNIT/ML ~~LOC~~ SOLN: SUBCUTANEOUS | 12 refills | Status: DC

## 2017-11-21 NOTE — Progress Notes (Signed)
Prenatal Visit Note Date: 11/21/2017 Clinic: [facility]  Subjective:  Haley [name] is a 26 y.o. G1P0000 at 822w0d being seen today for ongoing prenatal care.  She is currently monitored for the following issues for this high-risk pregnancy and has Supervision of high-risk pregnancy; Obesity in pregnancy, antepartum; Obesity (BMI 30-39.9); DM (diabetes mellitus), type 2 (HCC); Alpha thalassemia trait; GBS bacteriuria; Preexisting diabetes complicating pregnancy, antepartum; and Polyhydramnios affecting pregnancy in third trimester on their problem list.  Patient reports no complaints.   Contractions: Irregular. Vag. Bleeding: None.  Movement: Present. Denies leaking of fluid.   The following portions of the patient's history were reviewed and updated as appropriate: allergies, current medications, past family history, past medical history, past social history, past surgical history and problem list. Problem list updated.  Objective:   Vitals:   11/21/17 0858  BP: 128/81  Pulse: 87  Weight: 224 lb 6.4 oz (101.8 kg)    Fetal Status: Fetal Heart Rate (bpm): 150 Fundal Height: 38 cm Movement: Present  Presentation: Vertex  General:  Alert, oriented and cooperative. Patient is in no acute distress.  Skin: Skin is warm and dry. No rash noted.   Cardiovascular: Normal heart rate noted  Respiratory: Normal respiratory effort, no problems with respiration noted  Abdomen: Soft, gravid, appropriate for gestational age. Pain/Pressure: Absent     Pelvic:  Cervical exam deferred        Extremities: Normal range of motion.  Edema: Trace  Mental Status: Normal mood and affect. Normal behavior. Normal judgment and thought content.   Urinalysis:      Assessment and Plan:  Pregnancy: G1P0000 at 11022w0d  1. Preexisting diabetes complicating pregnancy, antepartum Will increase to NPH 20/60 from 20/55 and aspart to 25/20/20 from 20/15/20 b/c AM fastings are mostly in the 100s and 2h  PP in the 130s-140s. F/u bpp for tomorrow, given holiday, and 2x/wk testing next week. Slightly high AFI and ULN AC with normal efw 64% on 12/21. Delivery at 39wks - insulin NPH Human (HUMULIN N,NOVOLIN N) 100 UNIT/ML injection; Administer 20 units with breakfast and 60 units at bedtime subcutaneously  Dispense: 10 mL; Refill: 3 - insulin aspart (NOVOLOG) 100 UNIT/ML injection; Administer 25 units with breakfast and 20 units with lunch and 25 units with dinner subcutaneously  Dispense: 10 mL; Refill: 12  2. Supervision of high risk pregnancy in third trimester D/w pt re: BC nv - Cervicovaginal ancillary only  3. Obesity (BMI 30-39.9)  4. Obesity in pregnancy, antepartum  5. GBS bacteriuria tx in labor  6. Polyhydramnios affecting pregnancy in third trimester F/u BPP for tomorrow  Preterm labor symptoms and general obstetric precautions including but not limited to vaginal bleeding, contractions, leaking of fluid and fetal movement were reviewed in detail with the patient. Please refer to After Visit Summary for other counseling recommendations.  Return in about 5 days (around 11/26/2017).   [place] BingPickens, Kinzie Wickes, MD

## 2017-11-22 ENCOUNTER — Ambulatory Visit (HOSPITAL_COMMUNITY)
Admission: RE | Admit: 2017-11-22 | Discharge: 2017-11-22 | Disposition: A | Discharge status: Home / Self Care | Payer: Medicaid Other | Source: Ambulatory Visit | Attending: Obstetrics & Gynecology | Admitting: Obstetrics & Gynecology

## 2017-11-22 DIAGNOSIS — O99013 Anemia complicating pregnancy, third trimester: Secondary | ICD-10-CM | POA: Diagnosis not present

## 2017-11-22 DIAGNOSIS — O24113 Pre-existing diabetes mellitus, type 2, in pregnancy, third trimester: Secondary | ICD-10-CM | POA: Diagnosis present

## 2017-11-22 DIAGNOSIS — O0993 Supervision of high risk pregnancy, unspecified, third trimester: Secondary | ICD-10-CM | POA: Diagnosis present

## 2017-11-22 DIAGNOSIS — Z362 Encounter for other antenatal screening follow-up: Secondary | ICD-10-CM | POA: Insufficient documentation

## 2017-11-22 DIAGNOSIS — Z3A36 36 weeks gestation of pregnancy: Secondary | ICD-10-CM | POA: Insufficient documentation

## 2017-11-22 DIAGNOSIS — D569 Thalassemia, unspecified: Secondary | ICD-10-CM | POA: Insufficient documentation

## 2017-11-22 LAB — CERVICOVAGINAL ANCILLARY ONLY
Chlamydia: NEGATIVE
NEISSERIA GONORRHEA: NEGATIVE

## 2017-11-22 NOTE — Addendum Note (Signed)
Encounter addended by: Lenise ArenaBazemore, Yaacov Koziol, RDMS on: 11/22/2017 3:02 PM  Actions taken: Imaging Exam ended

## 2017-11-26 ENCOUNTER — Ambulatory Visit (INDEPENDENT_AMBULATORY_CARE_PROVIDER_SITE_OTHER): Payer: Medicaid Other | Admitting: *Deleted

## 2017-11-26 VITALS — BP 132/77 | HR 93

## 2017-11-26 DIAGNOSIS — O24319 Unspecified pre-existing diabetes mellitus in pregnancy, unspecified trimester: Secondary | ICD-10-CM | POA: Diagnosis not present

## 2017-11-26 NOTE — Progress Notes (Signed)
I have reviewed this chart and agree with the RN/CMA assessment and management.   Reactive NST.  K. Meryl Davis, M.D. Attending Obstetrician & Gynecologist, [facility], [facility]  

## 2017-11-28 NOTE — Progress Notes (Signed)
NST was reviewed in the office. Reactive and reassuring

## 2017-11-29 ENCOUNTER — Inpatient Hospital Stay (HOSPITAL_COMMUNITY): Payer: Medicaid Other

## 2017-11-29 ENCOUNTER — Inpatient Hospital Stay (HOSPITAL_COMMUNITY)
Admission: AD | Admit: 2017-11-29 | Discharge: 2017-12-01 | DRG: 788 | Disposition: A | Discharge status: Home / Self Care | Payer: Medicaid Other | Source: Ambulatory Visit | Attending: Obstetrics & Gynecology | Admitting: Obstetrics & Gynecology

## 2017-11-29 ENCOUNTER — Encounter (HOSPITAL_COMMUNITY): Payer: Self-pay | Admitting: *Deleted

## 2017-11-29 ENCOUNTER — Other Ambulatory Visit: Payer: Self-pay

## 2017-11-29 ENCOUNTER — Ambulatory Visit (INDEPENDENT_AMBULATORY_CARE_PROVIDER_SITE_OTHER): Payer: Medicaid Other | Admitting: Family Medicine

## 2017-11-29 DIAGNOSIS — Z98891 History of uterine scar from previous surgery: Secondary | ICD-10-CM

## 2017-11-29 DIAGNOSIS — O24319 Unspecified pre-existing diabetes mellitus in pregnancy, unspecified trimester: Secondary | ICD-10-CM

## 2017-11-29 DIAGNOSIS — O288 Other abnormal findings on antenatal screening of mother: Secondary | ICD-10-CM

## 2017-11-29 DIAGNOSIS — Z794 Long term (current) use of insulin: Secondary | ICD-10-CM | POA: Diagnosis not present

## 2017-11-29 DIAGNOSIS — E669 Obesity, unspecified: Secondary | ICD-10-CM | POA: Diagnosis present

## 2017-11-29 DIAGNOSIS — O2412 Pre-existing diabetes mellitus, type 2, in childbirth: Secondary | ICD-10-CM | POA: Diagnosis not present

## 2017-11-29 DIAGNOSIS — Z3A37 37 weeks gestation of pregnancy: Secondary | ICD-10-CM | POA: Diagnosis not present

## 2017-11-29 DIAGNOSIS — R8271 Bacteriuria: Secondary | ICD-10-CM | POA: Diagnosis present

## 2017-11-29 DIAGNOSIS — O0990 Supervision of high risk pregnancy, unspecified, unspecified trimester: Secondary | ICD-10-CM

## 2017-11-29 DIAGNOSIS — O99214 Obesity complicating childbirth: Secondary | ICD-10-CM | POA: Diagnosis present

## 2017-11-29 DIAGNOSIS — O403XX0 Polyhydramnios, third trimester, not applicable or unspecified: Secondary | ICD-10-CM | POA: Diagnosis present

## 2017-11-29 DIAGNOSIS — O24313 Unspecified pre-existing diabetes mellitus in pregnancy, third trimester: Secondary | ICD-10-CM

## 2017-11-29 DIAGNOSIS — E119 Type 2 diabetes mellitus without complications: Secondary | ICD-10-CM | POA: Diagnosis present

## 2017-11-29 DIAGNOSIS — E118 Type 2 diabetes mellitus with unspecified complications: Secondary | ICD-10-CM

## 2017-11-29 DIAGNOSIS — O24919 Unspecified diabetes mellitus in pregnancy, unspecified trimester: Secondary | ICD-10-CM | POA: Diagnosis present

## 2017-11-29 DIAGNOSIS — D563 Thalassemia minor: Secondary | ICD-10-CM | POA: Diagnosis present

## 2017-11-29 DIAGNOSIS — O99824 Streptococcus B carrier state complicating childbirth: Secondary | ICD-10-CM | POA: Diagnosis present

## 2017-11-29 LAB — COMPREHENSIVE METABOLIC PANEL
ALBUMIN: 2.5 g/dL — AB (ref 3.5–5.0)
ALK PHOS: 119 U/L (ref 38–126)
ALT: 16 U/L (ref 14–54)
AST: 13 U/L — AB (ref 15–41)
Anion gap: 10 (ref 5–15)
BUN: 10 mg/dL (ref 6–20)
CALCIUM: 8.9 mg/dL (ref 8.9–10.3)
CHLORIDE: 103 mmol/L (ref 101–111)
CO2: 19 mmol/L — ABNORMAL LOW (ref 22–32)
Creatinine, Ser: 0.6 mg/dL (ref 0.44–1.00)
GFR calc non Af Amer: >60 mL/min (ref >60)
GLUCOSE: 224 mg/dL — AB (ref 65–99)
Potassium: 4 mmol/L (ref 3.5–5.1)
SODIUM: 132 mmol/L — AB (ref 135–145)
Total Bilirubin: 0.6 mg/dL (ref 0.3–1.2)
Total Protein: 7 g/dL (ref 6.5–8.1)

## 2017-11-29 LAB — GLUCOSE, CAPILLARY
GLUCOSE-CAPILLARY: 179 mg/dL — AB (ref 65–99)
GLUCOSE-CAPILLARY: 157 mg/dL — AB (ref 65–99)
GLUCOSE-CAPILLARY: 128 mg/dL — AB (ref 65–99)
GLUCOSE-CAPILLARY: 126 mg/dL — AB (ref 65–99)
Glucose-Capillary: 215 mg/dL — ABNORMAL HIGH (ref 65–99)
Glucose-Capillary: 178 mg/dL — ABNORMAL HIGH (ref 65–99)
Glucose-Capillary: 151 mg/dL — ABNORMAL HIGH (ref 65–99)
Glucose-Capillary: 125 mg/dL — ABNORMAL HIGH (ref 65–99)
Glucose-Capillary: 109 mg/dL — ABNORMAL HIGH (ref 65–99)
Glucose-Capillary: 167 mg/dL — ABNORMAL HIGH (ref 65–99)

## 2017-11-29 LAB — CBC
HCT: 39.1 % (ref 36.0–46.0)
HEMOGLOBIN: 12.7 g/dL (ref 12.0–15.0)
MCH: 24.7 pg — AB (ref 26.0–34.0)
MCHC: 32.5 g/dL (ref 30.0–36.0)
MCV: 76.1 fL — ABNORMAL LOW (ref 78.0–100.0)
PLATELETS: 156 10*3/uL (ref 150–400)
RBC: 5.14 MIL/uL — AB (ref 3.87–5.11)
RDW: 15.5 % (ref 11.5–15.5)
WBC: 12 10*3/uL — AB (ref 4.0–10.5)

## 2017-11-29 LAB — PROTEIN / CREATININE RATIO, URINE
Creatinine, Urine: 72 mg/dL
Protein Creatinine Ratio: 0.17 mg/mg{Cre} — ABNORMAL HIGH (ref 0.00–0.15)
Total Protein, Urine: 12 mg/dL

## 2017-11-29 LAB — ABO/RH: ABO/RH(D): O POS

## 2017-11-29 LAB — TYPE AND SCREEN
ABO/RH(D): O POS
ANTIBODY SCREEN: NEGATIVE

## 2017-11-29 MED ORDER — OXYTOCIN BOLUS FROM INFUSION: 500.0000 mL | Freq: Once | INTRAVENOUS | Status: DC

## 2017-11-29 MED ORDER — ONDANSETRON HCL 4 MG/2ML IJ SOLN: 4.0000 mg | Freq: Four times a day (QID) | INTRAMUSCULAR | Status: DC | PRN

## 2017-11-29 MED ORDER — OXYTOCIN 40 UNITS IN LACTATED RINGERS INFUSION - SIMPLE MED: 2.5000 [IU]/h | INTRAVENOUS | Status: DC

## 2017-11-29 MED ORDER — OXYCODONE-ACETAMINOPHEN 5-325 MG PO TABS: 2.0000 | ORAL_TABLET | ORAL | Status: DC | PRN

## 2017-11-29 MED ORDER — ACETAMINOPHEN 325 MG PO TABS: 650.0000 mg | ORAL_TABLET | ORAL | Status: DC | PRN

## 2017-11-29 MED ORDER — LACTATED RINGERS IV BOLUS (SEPSIS)
1000.0000 mL | Freq: Once | INTRAVENOUS | Status: AC
  Administered 2017-11-29: 1000 mL via INTRAVENOUS

## 2017-11-29 MED ORDER — INSULIN NPH (HUMAN) (ISOPHANE) 100 UNIT/ML ~~LOC~~ SUSP: SUBCUTANEOUS | 3 refills | Status: DC

## 2017-11-29 MED ORDER — INSULIN ASPART 100 UNIT/ML ~~LOC~~ SOLN
20.0000 [IU] | Freq: Once | SUBCUTANEOUS | Status: AC
  Administered 2017-11-29: 20 [IU] via SUBCUTANEOUS
  Filled 2017-11-29: qty 1

## 2017-11-29 MED ORDER — TERBUTALINE SULFATE 1 MG/ML IJ SOLN: 0.2500 mg | Freq: Once | INTRAMUSCULAR | Status: DC | PRN

## 2017-11-29 MED ORDER — DEXTROSE IN LACTATED RINGERS 5 % IV SOLN
INTRAVENOUS | Status: DC
  Administered 2017-11-29: 16:00:00 via INTRAVENOUS

## 2017-11-29 MED ORDER — LACTATED RINGERS IV SOLN
500.0000 mL | INTRAVENOUS | Status: DC | PRN
  Administered 2017-11-29: 500 mL via INTRAVENOUS

## 2017-11-29 MED ORDER — OXYCODONE-ACETAMINOPHEN 5-325 MG PO TABS: 1.0000 | ORAL_TABLET | ORAL | Status: DC | PRN

## 2017-11-29 MED ORDER — LACTATED RINGERS IV SOLN
INTRAVENOUS | Status: DC
  Administered 2017-11-29: 23:00:00 via INTRAVENOUS

## 2017-11-29 MED ORDER — SOD CITRATE-CITRIC ACID 500-334 MG/5ML PO SOLN
30.0000 mL | ORAL | Status: DC | PRN
  Administered 2017-11-30: 30 mL via ORAL
  Filled 2017-11-29: qty 15

## 2017-11-29 MED ORDER — OXYTOCIN 40 UNITS IN LACTATED RINGERS INFUSION - SIMPLE MED
1.0000 m[IU]/min | INTRAVENOUS | Status: DC
  Administered 2017-11-29: 1 m[IU]/min via INTRAVENOUS
  Filled 2017-11-29: qty 1000

## 2017-11-29 MED ORDER — SODIUM CHLORIDE 0.9 % IV SOLN
INTRAVENOUS | Status: DC
  Administered 2017-11-29: 1.2 [IU]/h via INTRAVENOUS
  Filled 2017-11-29: qty 1

## 2017-11-29 MED ORDER — LIDOCAINE HCL (PF) 1 % IJ SOLN: 30.0000 mL | INTRAMUSCULAR | Status: DC | PRN

## 2017-11-29 MED ORDER — FENTANYL CITRATE (PF) 100 MCG/2ML IJ SOLN
100.0000 ug | INTRAMUSCULAR | Status: DC | PRN
  Administered 2017-11-29: 100 ug via INTRAVENOUS
  Filled 2017-11-29: qty 2

## 2017-11-29 NOTE — MAU Provider Note (Signed)
History     CSN: [id number]  Arrival date and time: 11/29/17 1050   First Provider Initiated Contact with Patient 11/29/17 1122      Chief Complaint  Patient presents with  . fetal monitoring   G1 _0  wks sent from office for nonreactive NST and possible decels. Pt reports good FM. No VB, LOF, or ctx. She reports not checking her BS today or taking her Insulin because she spent the night at her sisters and left her supplies at home. She has not eaten today. Pregnancy is complicated by X4GY, polyhydramios, and GBS bacteruria.   OB History    Gravida Para Term Preterm AB Living   1 0 0 0 0 0   SAB TAB Ectopic Multiple Live Births   0 0 0 0 0      Past Medical History:  Diagnosis Date  . Alpha thalassemia trait 05/11/2017  . Diabetes mellitus without complication [facility])     Past Surgical History:  Procedure Laterality Date  . NO PAST SURGERIES      History reviewed. No pertinent family history.  Social History   Tobacco Use  . Smoking status: Never Smoker  . Smokeless tobacco: Never Used  Substance Use Topics  . Alcohol use: Yes    Comment: prior to known pregnancy  . Drug use: No    Allergies: No Known Allergies  Medications Prior to Admission  Medication Sig Dispense Refill Last Dose  . ACCU-CHEK FASTCLIX LANCETS MISC 1 Units by Percutaneous route 4 (four) times daily. 100 each 12 Taking  . aspirin EC 81 MG tablet Take 1 tablet (81 mg total) by mouth daily. Take after 12 weeks for prevention of preeclampsia later in pregnancy 300 tablet 2 Taking  . Blood Glucose Monitoring Suppl (ACCU-CHEK NANO SMARTVIEW) w/Device KIT 1 kit by Subdermal route as directed. Check blood sugars for fasting, and two hours after breakfast, lunch and dinner (4 checks daily) 1 kit 0 Taking  . glucose blood (ACCU-CHEK SMARTVIEW) test strip Use as instructed to check blood sugars 100 each 12 Taking  . insulin aspart (NOVOLOG) 100 UNIT/ML injection Administer 25 units with breakfast and 20  units with lunch and 25 units with dinner subcutaneously 10 mL 12 Taking  . insulin NPH Human (HUMULIN N,NOVOLIN N) 100 UNIT/ML injection Administer 20 units with breakfast and 60 units at bedtime subcutaneously 10 mL 3   . metFORMIN (GLUCOPHAGE) 1000 MG tablet Take 1 tablet (1,000 mg total) by mouth 2 (two) times daily with a meal. 60 tablet 3 Taking  . Prenatal Vit-Fe Fumarate-FA (PRENATAL VITAMIN PO) Take 1 tablet by mouth daily.   Taking    Review of Systems  Eyes: Negative for visual disturbance.  Gastrointestinal: Negative for abdominal pain.  Genitourinary: Negative for vaginal bleeding.  Neurological: Negative for headaches.   Physical Exam   Blood pressure 132/80, pulse 100, temperature 98.5 F (36.9 C), temperature source Oral, resp. rate 18, last menstrual period 03/14/2017.  Physical Exam  Nursing note and vitals reviewed. Constitutional: She is oriented to person, place, and time. She appears well-developed and well-nourished. No distress.  HENT:  Head: Normocephalic and atraumatic.  Neck: Normal range of motion.  Respiratory: Effort normal. No respiratory distress.  Musculoskeletal: Normal range of motion.  Neurological: She is alert and oriented to person, place, and time.  Skin: Skin is warm and dry.  Psychiatric: She has a normal mood and affect.  EFM: 155 bpm, min variability, no accels, ? late decels Toco: none  Results for orders placed or performed during the hospital encounter of 11/29/17 (from the past 24 hour(s))  Type and screen     Status: None (Preliminary result)   Collection Time: 11/29/17 11:32 AM  Result Value Ref Range   ABO/RH(D) O POS    Antibody Screen PENDING    Sample Expiration 12/02/2017   CBC     Status: Abnormal   Collection Time: 11/29/17 11:32 AM  Result Value Ref Range   WBC 12.0 (H) 4.0 - 10.5 K/uL   RBC 5.14 (H) 3.87 - 5.11 MIL/uL   Hemoglobin 12.7 12.0 - 15.0 g/dL   HCT 39.1 36.0 - 46.0 %   MCV 76.1 (L) 78.0 - 100.0 fL   MCH  24.7 (L) 26.0 - 34.0 pg   MCHC 32.5 30.0 - 36.0 g/dL   RDW 15.5 11.5 - 15.5 %   Platelets 156 150 - 400 K/uL  Comprehensive metabolic panel     Status: Abnormal   Collection Time: 11/29/17 11:32 AM  Result Value Ref Range   Sodium 132 (L) 135 - 145 mmol/L   Potassium 4.0 3.5 - 5.1 mmol/L   Chloride 103 101 - 111 mmol/L   CO2 19 (L) 22 - 32 mmol/L   Glucose, Bld 224 (H) 65 - 99 mg/dL   BUN 10 6 - 20 mg/dL   Creatinine, Ser 0.60 0.44 - 1.00 mg/dL   Calcium 8.9 8.9 - 10.3 mg/dL   Total Protein 7.0 6.5 - 8.1 g/dL   Albumin 2.5 (L) 3.5 - 5.0 g/dL   AST 13 (L) 15 - 41 U/L   ALT 16 14 - 54 U/L   Alkaline Phosphatase 119 38 - 126 U/L   Total Bilirubin 0.6 0.3 - 1.2 mg/dL   GFR calc non Af Amer >60 >60 mL/min   GFR calc Af Amer >60 >60 mL/min   Anion gap 10 5 - 15  Glucose, capillary     Status: Abnormal   Collection Time: 11/29/17 11:42 AM  Result Value Ref Range   Glucose-Capillary 215 (H) 65 - 99 mg/dL  Protein / creatinine ratio, urine     Status: Abnormal   Collection Time: 11/29/17 12:22 PM  Result Value Ref Range   Creatinine, Urine 72.00 mg/dL   Total Protein, Urine 12 mg/dL   Protein Creatinine Ratio 0.17 (H) 0.00 - 0.15 mg/mg[Cre]   MAU Course  Procedures Novolog Insulin 20U  MDM Labs and BPP ordered and reviewed. BPP 8/8, AFI 27cm. One elevated BP, pre-e labs nml. CBG 215, Dr. Roselie Awkward recommends Insulin and have pt eat. Fetal heart tracing not improved after eating, minimal variability and ?decels. Consult with Dr. Ihor Dow, plan for admit.  Assessment and Plan  [redacted] weeks gestation Cat II FHT T2DM Admit to BS Mngt per labor team- spoke with Leftwich-Kirby, CNM  Julianne Handler, CNM 11/29/2017, 11:32 AM

## 2017-11-29 NOTE — MAU Note (Signed)
Pt sent from MD office for non-reactive NST & late decels.  Pt states she has been feeling normal fetal movement, occasional uc's.  Denies bleeding or LOF.

## 2017-11-29 NOTE — Progress Notes (Addendum)
Haley [name] is a 27 y.o. G1P0000 at 4145w1d by LMP c/w 7 week US admitted for induction of labor due to Non-reactive NST.  Subjective: Pt comfortable, family in room for support.  Objective: BP 131/65   Pulse 100   Temp 98.4 F (36.9 C) (Oral)   Resp 18   Ht 5\' 2"  (1.575 m)   Wt 218 lb (98.9 kg)   LMP 03/14/2017 (Exact Date)   BMI 39.87 kg/m  No intake/output data recorded. No intake/output data recorded.  FHT:  FHR: 145 bpm, variability: moderate,  accelerations:  Abscent,  decelerations:  Present repetitive 10-15 beats below baseline lasting 40-60 seconds, not associated with contractions . FHR tracing reviewed with Dr Erin FullingHarraway-Smith.  UC:   none SVE:   Dilation: Fingertip Effacement (%): 50 Station: -3 Exam by:: L. Leftwich-Kirby CNM  Foley bulb placed by CNM without difficulty and inflated by RN to 60 cc with LR.  Pt tolerated well.   Labs: Lab Results  Component Value Date   WBC 12.0 (H) 11/29/2017   HGB 12.7 11/29/2017   HCT 39.1 11/29/2017   MCV 76.1 (L) 11/29/2017   PLT 156 11/29/2017    Assessment / Plan: Induction of labor due to non-reassuring fetal testing FB in place   Labor: Start Pitocin low-dose, start at 2 and increase by 1 until 6 milliunits/min max. Preeclampsia:  n/a Fetal Wellbeing:  Category II Pain Control:  Labor support without medications I/D:  GBS unknown, collected on admission Anticipated MOD:  NSVD  Sharen CounterLisa Leftwich-Kirby 11/29/2017, 4:48 PM

## 2017-11-29 NOTE — Anesthesia Pain Management Evaluation Note (Signed)
  CRNA Pain Management Visit Note  Patient: Haley [name], 27 y.o., female  "Hello I am a [facility] of the [facility]. We have an anesthesia team available at all times to provide care throughout the hospital, including epidural management and anesthesia for C-section. I don't know your plan for the delivery whether it a natural birth, water birth, IV sedation, nitrous supplementation, doula or epidural, but we want to meet your pain goals."   1.Was your pain managed to your expectations on prior hospitalizations?   Yes   2.What is your expectation for pain management during this hospitalization?     Epidural  3.How can we help you reach that goal? Nursing support and epidural  Record the patient's initial score and the patient's pain goal.   Pain: 4/10  Pain Goal: 0/10 [facility] wants you to be able to say your pain was always managed very well.  Salome ArntSterling, Vinod Mikesell Marie 11/29/2017

## 2017-11-29 NOTE — H&P (Signed)
Haley [name] is a 27 y.o. female G1P0000 @[redacted]w[redacted]d  presenting to MAU from the office for nonreactive NST.  She has BPP 8/8 but FHR tracing remains nonreactive with repetitive decelerations not associated with cramping/contractions.  Pt glucose was elevated in 200s because she stayed at her sister's house last night and left her insulin and diabetic supplies at home.  [facility] for nonreactive NST, GDM on insulin.    [facility] Prenatal Labs  [facility]  [facility] ([facility]=7[facility] u/s) Blood type: O/Positive/-- (06/07 1002) O pos  Genetic Screen Declined  Antibody:Negative (06/07 1002)neg  Anatomic US  Normal Rubella: 1.94 (06/07 1002)imm  GDM screening n/a RPR: Non Reactive (06/07 1002) nr  Flu vaccine Declined HBsAg: Negative (06/07 1002) neg  TDaP vaccine  10-02-17 HIV:   neg  Baby Food Breast                                 ZOX:[id number]:Positive in urine  Contraception Nexplanon Pap: NILM 2016 per patient  Circumcision n/a   Pediatrician Undecided    Support Person FOB: Gerre PebblesGarrett, family members         OB History    Gravida Para Term Preterm AB Living   1 0 0 0 0 0   SAB TAB Ectopic Multiple Live Births   0 0 0 0 0     Past Medical History:  Diagnosis Date  . Alpha thalassemia trait 05/11/2017  . Diabetes mellitus without complication [facility](HCC)    Past Surgical History:  Procedure Laterality Date  . NO PAST SURGERIES     Family History: family history is not on file. Social History:  reports that  has never smoked. she has never used smokeless tobacco. She reports that she drinks alcohol. She reports that she does not use drugs.     Maternal Diabetes: Yes:  Diabetes Type:  Insulin/Medication controlled Genetic Screening: Declined Maternal Ultrasounds/Referrals: Abnormal:  Findings:   Other: Fetal Ultrasounds or other Referrals:  Fetal echo Maternal Substance Abuse:  No Significant Maternal Medications:  Meds include: Other:  Significant Maternal Lab  Results:  Lab values include: Other:  Other Comments:  Polyhydramnios on US 11/22/17; Fetal echo wnl; Pt on insulin for GDM; GBS unknown, collected on 11/29/17 on admission  Review of Systems  Constitutional: Negative for chills, fever and malaise/fatigue.  Eyes: Negative for blurred vision.  Respiratory: Negative for cough and shortness of breath.   Cardiovascular: Negative for chest pain.  Gastrointestinal: Negative for abdominal pain, heartburn and vomiting.  Genitourinary: Negative for dysuria, frequency and urgency.  Musculoskeletal: Negative.   Neurological: Negative for dizziness and headaches.  Psychiatric/Behavioral: Negative for depression.   Maternal Medical History:  Reason for admission: Nonreactive NST, GDM on insulin   Contractions: Frequency: rare and irregular.   Perceived severity is mild.    Fetal activity: Perceived fetal activity is normal.   Last perceived fetal movement was within the past hour.    Prenatal complications: Polyhydramnios.   Prenatal Complications - Diabetes: type 2. Diabetes is managed by insulin injections.      Dilation: Fingertip Effacement (%): 50 Station: -3 Exam by:: L. Leftwich-Kirby CNM Blood pressure (!) 141/88, pulse (!) 108, temperature 98.4 F (36.9 C), temperature source Oral, resp. rate 18, height 5\' 2"  (1.575 m), weight 218 lb (98.9 kg), last menstrual period 03/14/2017.  Patient Vitals for the past 24 hrs:  BP Temp  Temp src Pulse Resp Height Weight  11/29/17 1700 139/81 - - 95 18 - -  11/29/17 1636 131/65 - - 100 18 - -  11/29/17 1518 - - - - - 5\' 2"  (1.575 m) 218 lb (98.9 kg)  11/29/17 1515 (!) 141/88 98.4 F (36.9 C) Oral (!) 108 18 - -  11/29/17 1331 127/84 - - (!) 104 - - -  11/29/17 1316 134/86 - - 98 - - -  11/29/17 1301 127/81 - - 96 - - -  11/29/17 1246 135/70 - - 96 - - -  11/29/17 1232 119/74 - - (!) 102 - - -  11/29/17 1107 132/80 - - 100 - - -  11/29/17 1101 (!) 137/91 98.5 F (36.9 C) Oral (!) 107 18 -  -   Maternal Exam:  Uterine Assessment: Contraction strength is mild.  Contraction frequency is rare.   Abdomen: Estimated fetal weight is 64%tile AC 82%tile, AFI 26.7.   Fetal presentation: vertex  Pelvis: adequate for delivery.   Cervix: Cervix evaluated by digital exam.     Physical Exam  Nursing note and vitals reviewed. Constitutional: She is oriented to person, place, and time. She appears well-developed and well-nourished.  Neck: Normal range of motion.  Cardiovascular: Normal rate, regular rhythm and normal heart sounds.  Respiratory: Effort normal and breath sounds normal.  GI: Soft.  Musculoskeletal: Normal range of motion.  Neurological: She is alert and oriented to person, place, and time.  Skin: Skin is warm and dry.  Psychiatric: She has a normal mood and affect. Her behavior is normal. Judgment and thought content normal.    Prenatal labs: ABO, Rh: --/--/O POS (01/03 1132) Antibody: NEG (01/03 1132) Rubella: 1.94 (06/07 1002) RPR: Non Reactive (11/06 0945)  HBsAg: Negative (06/07 1002)  HIV: Non Reactive (11/06 0945)  GBS:   Unknown, collected 11/29/17  Assessment/Plan: 27 y.o. G1P0000 @[redacted]w[redacted]d  by LMP c/w 7 week [place]  A2/B DM on insulin GBS unknown Nonreactive NST  Admit to [facility] IOL with Foley Bulb/Pitocin Continuous EFM Q 1 hour CBGs, start glucostabilizer with BS >120 Anticipate NSVD  Sharen Counter 11/29/2017, 3:35 PM

## 2017-11-29 NOTE — Progress Notes (Signed)
Haley [name] is a 27 y.o. G1P0000 at 3340w1d by LMP admitted for induction of labor due to Non-reactive NST.  Subjective: Pt comfortable, family in room for support  Objective: BP 123/73   Pulse (!) 103   Temp 99.8 F (37.7 C) (Oral)   Resp 18   Ht 5\' 2"  (1.575 m)   Wt 218 lb (98.9 kg)   LMP 03/14/2017 (Exact Date)   BMI 39.87 kg/m  No intake/output data recorded. No intake/output data recorded.  FHT:  FHR: 150 bpm, variability: moderate with periods of minimal,  accelerations:  Abscent,  decelerations:  Present episodic subtle decels 10-15 beats from baseline, lasting 50-70 seconds, isolated prolonged decel to 120, lasting 4 minutes. FHR tracing reviewed with Dr Erin FullingHarraway-Smith. UC:   Rare, mild to palpation SVE:   Deferred, FB in place  Labs: Lab Results  Component Value Date   WBC 12.0 (H) 11/29/2017   HGB 12.7 11/29/2017   HCT 39.1 11/29/2017   MCV 76.1 (L) 11/29/2017   PLT 156 11/29/2017    Assessment / Plan: Induction of labor due to non-reassuring fetal testing FB in place Pitocin at 6 milliunits/min  Labor: IOL with FB, progressing normally Preeclampsia:  labs stable Fetal Wellbeing:  Category II Pain Control:  Labor support without medications I/D:  GBS unknown Anticipated MOD:  NSVD  Sharen CounterLisa Leftwich-Kirby 11/29/2017, 7:56 PM

## 2017-11-29 NOTE — Progress Notes (Signed)
Patient ID: Haley [name], female   DOB: 12-19-1990, 27 y.o.   MRN: [id number] Doing well with pain med  Vitals:   11/29/17 2012 11/29/17 2040 11/29/17 2100 11/29/17 2130  BP: (!) 141/87 (!) 143/86 137/86 (!) 142/81  Pulse: 95 96 99 93  Resp: 18 18 18 16   Temp:      TempSrc:      Weight:      Height:       FHR baseline wanders, ?145 - 155 Fair variability ? decels UCs not tracing well Category 2  Dilation: Fingertip Effacement (%): 50 Cervical Position: Posterior Station: -3, Ballotable Presentation: Vertex Exam by:: (leftwich-kirby)   Will continue Low Dose Pitocin No cytotec

## 2017-11-29 NOTE — MAU Note (Signed)
L. Leftwich-Kirby CNM in to discuss admission & plan of care with pt & her family.

## 2017-11-29 NOTE — Progress Notes (Signed)
   PRENATAL VISIT NOTE  Subjective:  Haley [name] is a 27 y.o. G1P0000 at 3229w1d being seen today for ongoing prenatal care.  She is currently monitored for the following issues for this high-risk pregnancy and has Supervision of high-risk pregnancy; Obesity in pregnancy, antepartum; Obesity (BMI 30-39.9); DM (diabetes mellitus), type 2 (HCC); Alpha thalassemia trait; GBS bacteriuria; Preexisting diabetes complicating pregnancy, antepartum; and Polyhydramnios affecting pregnancy in third trimester on their problem list.  Patient reports no complaints.  Contractions: Irregular. Vag. Bleeding: None.  Movement: Present. Denies leaking of fluid.   The following portions of the patient's history were reviewed and updated as appropriate: allergies, current medications, past family history, past medical history, past social history, past surgical history and problem list. Problem list updated.  Objective:   Vitals:   11/29/17 0912  BP: 128/80  Pulse: (!) 101  Weight: 218 lb (98.9 kg)    Fetal Status: Fetal Heart Rate (bpm): NST   Movement: Present     General:  Alert, oriented and cooperative. Patient is in no acute distress.  Skin: Skin is warm and dry. No rash noted.   Cardiovascular: Normal heart rate noted  Respiratory: Normal respiratory effort, no problems with respiration noted  Abdomen: Soft, gravid, appropriate for gestational age.  Pain/Pressure: Absent     Pelvic: Cervical exam deferred        Extremities: Normal range of motion.  Edema: Trace  Mental Status:  Normal mood and affect. Normal behavior. Normal judgment and thought content.  NST:  Baseline: 155 bpm, Variability: Fair (1-6 bpm), Accelerations: non-reactive and Decelerations: Late  Assessment and Plan:  Pregnancy: G1P0000 at 7029w1d  1. Preexisting diabetes complicating pregnancy, antepartum Poorly controlled--just had insulin adjusted 3 days ago, may need more than that NST is not reactive with late decels, will send  to hospital for monitoring and BPP. Discussed with [facility] on Labor today Last u/s shows 54% growth - Fetal nonstress test - insulin NPH Human (HUMULIN N,NOVOLIN N) 100 UNIT/ML injection; Administer 20 units with breakfast and 60 units at bedtime subcutaneously  Dispense: 10 mL; Refill: 3  Preterm labor symptoms and general obstetric precautions including but not limited to vaginal bleeding, contractions, leaking of fluid and fetal movement were reviewed in detail with the patient. Please refer to After Visit Summary for other counseling recommendations.  [facility].  Reva Boresanya S Dejana Pugsley, MD

## 2017-11-30 ENCOUNTER — Inpatient Hospital Stay (HOSPITAL_COMMUNITY): Payer: Medicaid Other | Admitting: Anesthesiology

## 2017-11-30 ENCOUNTER — Encounter (HOSPITAL_COMMUNITY)
Admission: AD | Disposition: A | Discharge status: Home / Self Care | Payer: Self-pay | Source: Ambulatory Visit | Attending: Obstetrics & Gynecology

## 2017-11-30 ENCOUNTER — Encounter (HOSPITAL_COMMUNITY): Payer: Self-pay | Admitting: Anesthesiology

## 2017-11-30 DIAGNOSIS — Z794 Long term (current) use of insulin: Secondary | ICD-10-CM

## 2017-11-30 DIAGNOSIS — O2412 Pre-existing diabetes mellitus, type 2, in childbirth: Secondary | ICD-10-CM

## 2017-11-30 DIAGNOSIS — O24919 Unspecified diabetes mellitus in pregnancy, unspecified trimester: Secondary | ICD-10-CM | POA: Diagnosis present

## 2017-11-30 DIAGNOSIS — E119 Type 2 diabetes mellitus without complications: Secondary | ICD-10-CM

## 2017-11-30 DIAGNOSIS — Z3A37 37 weeks gestation of pregnancy: Secondary | ICD-10-CM

## 2017-11-30 LAB — CBC
HCT: 35.6 % — ABNORMAL LOW (ref 36.0–46.0)
HEMOGLOBIN: 11.7 g/dL — AB (ref 12.0–15.0)
MCH: 24.7 pg — ABNORMAL LOW (ref 26.0–34.0)
MCHC: 32.9 g/dL (ref 30.0–36.0)
MCV: 75.3 fL — ABNORMAL LOW (ref 78.0–100.0)
PLATELETS: 173 10*3/uL (ref 150–400)
RBC: 4.73 MIL/uL (ref 3.87–5.11)
RDW: 15.5 % (ref 11.5–15.5)
WBC: 13.8 10*3/uL — ABNORMAL HIGH (ref 4.0–10.5)

## 2017-11-30 LAB — GLUCOSE, CAPILLARY
GLUCOSE-CAPILLARY: 119 mg/dL — AB (ref 65–99)
GLUCOSE-CAPILLARY: 156 mg/dL — AB (ref 65–99)
GLUCOSE-CAPILLARY: 183 mg/dL — AB (ref 65–99)
GLUCOSE-CAPILLARY: 80 mg/dL (ref 65–99)
GLUCOSE-CAPILLARY: 82 mg/dL (ref 65–99)
Glucose-Capillary: 155 mg/dL — ABNORMAL HIGH (ref 65–99)

## 2017-11-30 LAB — RPR: RPR Ser Ql: NONREACTIVE

## 2017-11-30 SURGERY — Surgical Case
Anesthesia: Spinal | Wound class: Clean Contaminated

## 2017-11-30 MED ORDER — BUPIVACAINE IN DEXTROSE 0.75-8.25 % IT SOLN
INTRATHECAL | Status: DC | PRN
  Administered 2017-11-30: 1.5 mL via INTRATHECAL

## 2017-11-30 MED ORDER — TETANUS-DIPHTH-ACELL PERTUSSIS 5-2.5-18.5 LF-MCG/0.5 IM SUSP: 0.5000 mL | Freq: Once | INTRAMUSCULAR | Status: DC

## 2017-11-30 MED ORDER — ZOLPIDEM TARTRATE 5 MG PO TABS: 5.0000 mg | ORAL_TABLET | Freq: Every evening | ORAL | Status: DC | PRN

## 2017-11-30 MED ORDER — SCOPOLAMINE 1 MG/3DAYS TD PT72: 1.0000 | MEDICATED_PATCH | Freq: Once | TRANSDERMAL | Status: DC

## 2017-11-30 MED ORDER — LACTATED RINGERS IV SOLN
INTRAVENOUS | Status: DC
  Administered 2017-11-30: 12:00:00 via INTRAVENOUS

## 2017-11-30 MED ORDER — MEPERIDINE HCL 25 MG/ML IJ SOLN: 6.2500 mg | INTRAMUSCULAR | Status: DC | PRN

## 2017-11-30 MED ORDER — SIMETHICONE 80 MG PO CHEW
80.0000 mg | CHEWABLE_TABLET | ORAL | Status: DC
  Administered 2017-12-01: 80 mg via ORAL
  Filled 2017-11-30: qty 1

## 2017-11-30 MED ORDER — OXYTOCIN 10 UNIT/ML IJ SOLN
INTRAMUSCULAR | Status: AC
  Filled 2017-11-30: qty 4

## 2017-11-30 MED ORDER — NALBUPHINE HCL 10 MG/ML IJ SOLN: 5.0000 mg | Freq: Once | INTRAMUSCULAR | Status: DC | PRN

## 2017-11-30 MED ORDER — SCOPOLAMINE 1 MG/3DAYS TD PT72
MEDICATED_PATCH | TRANSDERMAL | Status: DC | PRN
  Administered 2017-11-30: 1 via TRANSDERMAL

## 2017-11-30 MED ORDER — PRENATAL MULTIVITAMIN CH
1.0000 | ORAL_TABLET | Freq: Every day | ORAL | Status: DC
  Administered 2017-11-30 – 2017-12-01 (×2): 1 via ORAL
  Filled 2017-11-30 (×2): qty 1

## 2017-11-30 MED ORDER — PHENYLEPHRINE 8 MG IN D5W 100 ML (0.08MG/ML) PREMIX OPTIME
INJECTION | INTRAVENOUS | Status: DC | PRN
  Administered 2017-11-30: 60 ug/min via INTRAVENOUS

## 2017-11-30 MED ORDER — NALOXONE HCL 0.4 MG/ML IJ SOLN: 0.4000 mg | INTRAMUSCULAR | Status: DC | PRN

## 2017-11-30 MED ORDER — IBUPROFEN 600 MG PO TABS
600.0000 mg | ORAL_TABLET | Freq: Four times a day (QID) | ORAL | Status: DC
  Administered 2017-11-30 – 2017-12-01 (×6): 600 mg via ORAL
  Filled 2017-11-30 (×6): qty 1

## 2017-11-30 MED ORDER — DIPHENHYDRAMINE HCL 25 MG PO CAPS: 25.0000 mg | ORAL_CAPSULE | Freq: Four times a day (QID) | ORAL | Status: DC | PRN

## 2017-11-30 MED ORDER — ACETAMINOPHEN 325 MG PO TABS
650.0000 mg | ORAL_TABLET | ORAL | Status: DC | PRN
  Administered 2017-12-01: 650 mg via ORAL
  Filled 2017-11-30: qty 2

## 2017-11-30 MED ORDER — CEFOTETAN DISODIUM-DEXTROSE 2-2.08 GM-%(50ML) IV SOLR: 2.0000 g | INTRAVENOUS | Status: DC

## 2017-11-30 MED ORDER — OXYCODONE-ACETAMINOPHEN 5-325 MG PO TABS: 2.0000 | ORAL_TABLET | ORAL | Status: DC | PRN

## 2017-11-30 MED ORDER — KETOROLAC TROMETHAMINE 30 MG/ML IJ SOLN: 30.0000 mg | Freq: Four times a day (QID) | INTRAMUSCULAR | Status: AC | PRN

## 2017-11-30 MED ORDER — OXYTOCIN 40 UNITS IN LACTATED RINGERS INFUSION - SIMPLE MED: 2.5000 [IU]/h | INTRAVENOUS | Status: AC

## 2017-11-30 MED ORDER — FENTANYL CITRATE (PF) 100 MCG/2ML IJ SOLN: 25.0000 ug | INTRAMUSCULAR | Status: DC | PRN

## 2017-11-30 MED ORDER — CEFAZOLIN SODIUM-DEXTROSE 2-3 GM-%(50ML) IV SOLR
INTRAVENOUS | Status: AC
  Filled 2017-11-30: qty 50

## 2017-11-30 MED ORDER — ACETAMINOPHEN 500 MG PO TABS
1000.0000 mg | ORAL_TABLET | Freq: Four times a day (QID) | ORAL | Status: AC
  Administered 2017-11-30 (×3): 1000 mg via ORAL
  Filled 2017-11-30 (×3): qty 2

## 2017-11-30 MED ORDER — LACTATED RINGERS IV SOLN: INTRAVENOUS | Status: DC

## 2017-11-30 MED ORDER — PHENYLEPHRINE 8 MG IN D5W 100 ML (0.08MG/ML) PREMIX OPTIME
INJECTION | INTRAVENOUS | Status: AC
  Filled 2017-11-30: qty 100

## 2017-11-30 MED ORDER — ONDANSETRON HCL 4 MG/2ML IJ SOLN: 4.0000 mg | Freq: Three times a day (TID) | INTRAMUSCULAR | Status: DC | PRN

## 2017-11-30 MED ORDER — FENTANYL CITRATE (PF) 100 MCG/2ML IJ SOLN
INTRAMUSCULAR | Status: DC | PRN
  Administered 2017-11-30: 10 ug via INTRATHECAL

## 2017-11-30 MED ORDER — BUPIVACAINE HCL (PF) 0.5 % IJ SOLN
INTRAMUSCULAR | Status: AC
  Filled 2017-11-30: qty 30

## 2017-11-30 MED ORDER — ONDANSETRON HCL 4 MG/2ML IJ SOLN
INTRAMUSCULAR | Status: DC | PRN
  Administered 2017-11-30: 4 mg via INTRAVENOUS

## 2017-11-30 MED ORDER — FENTANYL CITRATE (PF) 100 MCG/2ML IJ SOLN
INTRAMUSCULAR | Status: AC
  Filled 2017-11-30: qty 2

## 2017-11-30 MED ORDER — MORPHINE SULFATE (PF) 0.5 MG/ML IJ SOLN
INTRAMUSCULAR | Status: DC | PRN
  Administered 2017-11-30: .2 mg via INTRATHECAL

## 2017-11-30 MED ORDER — NALBUPHINE HCL 10 MG/ML IJ SOLN: 5.0000 mg | INTRAMUSCULAR | Status: DC | PRN

## 2017-11-30 MED ORDER — LACTATED RINGERS IV SOLN
INTRAVENOUS | Status: DC | PRN
  Administered 2017-11-30: 02:00:00 via INTRAVENOUS

## 2017-11-30 MED ORDER — COCONUT OIL OIL: 1.0000 "application " | TOPICAL_OIL | Status: DC | PRN

## 2017-11-30 MED ORDER — DEXAMETHASONE SODIUM PHOSPHATE 10 MG/ML IJ SOLN
INTRAMUSCULAR | Status: AC
  Filled 2017-11-30: qty 1

## 2017-11-30 MED ORDER — MORPHINE SULFATE (PF) 0.5 MG/ML IJ SOLN
INTRAMUSCULAR | Status: AC
  Filled 2017-11-30: qty 10

## 2017-11-30 MED ORDER — SENNOSIDES-DOCUSATE SODIUM 8.6-50 MG PO TABS
2.0000 | ORAL_TABLET | ORAL | Status: DC
  Administered 2017-12-01: 2 via ORAL
  Filled 2017-11-30: qty 2

## 2017-11-30 MED ORDER — OXYTOCIN 10 UNIT/ML IJ SOLN
INTRAVENOUS | Status: DC | PRN
  Administered 2017-11-30: 40 [IU] via INTRAVENOUS

## 2017-11-30 MED ORDER — MENTHOL 3 MG MT LOZG: 1.0000 | LOZENGE | OROMUCOSAL | Status: DC | PRN

## 2017-11-30 MED ORDER — ONDANSETRON HCL 4 MG/2ML IJ SOLN
INTRAMUSCULAR | Status: AC
  Filled 2017-11-30: qty 2

## 2017-11-30 MED ORDER — SODIUM CHLORIDE 0.9% FLUSH: 3.0000 mL | INTRAVENOUS | Status: DC | PRN

## 2017-11-30 MED ORDER — SIMETHICONE 80 MG PO CHEW
80.0000 mg | CHEWABLE_TABLET | Freq: Three times a day (TID) | ORAL | Status: DC
  Administered 2017-11-30 – 2017-12-01 (×5): 80 mg via ORAL
  Filled 2017-11-30 (×5): qty 1

## 2017-11-30 MED ORDER — METFORMIN HCL 500 MG PO TABS
1000.0000 mg | ORAL_TABLET | Freq: Two times a day (BID) | ORAL | Status: DC
  Administered 2017-11-30 – 2017-12-01 (×4): 1000 mg via ORAL
  Filled 2017-11-30 (×6): qty 2

## 2017-11-30 MED ORDER — DIPHENHYDRAMINE HCL 25 MG PO CAPS
25.0000 mg | ORAL_CAPSULE | ORAL | Status: DC | PRN
Start: 2017-11-30 — End: 2017-12-01
  Filled 2017-11-30: qty 1

## 2017-11-30 MED ORDER — SCOPOLAMINE 1 MG/3DAYS TD PT72
MEDICATED_PATCH | TRANSDERMAL | Status: AC
  Filled 2017-11-30: qty 1

## 2017-11-30 MED ORDER — PROMETHAZINE HCL 25 MG/ML IJ SOLN: 6.2500 mg | INTRAMUSCULAR | Status: DC | PRN

## 2017-11-30 MED ORDER — CEFAZOLIN SODIUM-DEXTROSE 2-3 GM-%(50ML) IV SOLR
INTRAVENOUS | Status: DC | PRN
  Administered 2017-11-30: 2 g via INTRAVENOUS

## 2017-11-30 MED ORDER — MEASLES, MUMPS & RUBELLA VAC ~~LOC~~ INJ
0.5000 mL | INJECTION | Freq: Once | SUBCUTANEOUS | Status: DC
  Filled 2017-11-30: qty 0.5

## 2017-11-30 MED ORDER — DIPHENHYDRAMINE HCL 50 MG/ML IJ SOLN: 12.5000 mg | INTRAMUSCULAR | Status: DC | PRN

## 2017-11-30 MED ORDER — WITCH HAZEL-GLYCERIN EX PADS: 1.0000 "application " | MEDICATED_PAD | CUTANEOUS | Status: DC | PRN

## 2017-11-30 MED ORDER — SIMETHICONE 80 MG PO CHEW: 80.0000 mg | CHEWABLE_TABLET | ORAL | Status: DC | PRN

## 2017-11-30 MED ORDER — DIBUCAINE 1 % RE OINT: 1.0000 "application " | TOPICAL_OINTMENT | RECTAL | Status: DC | PRN

## 2017-11-30 MED ORDER — NALOXONE HCL 0.4 MG/ML IJ SOLN
1.0000 ug/kg/h | INTRAVENOUS | Status: DC | PRN
  Filled 2017-11-30: qty 5

## 2017-11-30 MED ORDER — OXYCODONE-ACETAMINOPHEN 5-325 MG PO TABS: 1.0000 | ORAL_TABLET | ORAL | Status: DC | PRN

## 2017-11-30 SURGICAL SUPPLY — 35 items
BENZOIN TINCTURE PRP APPL 2/3 (GAUZE/BANDAGES/DRESSINGS) ×3 IMPLANT
BLADE TIP J-PLASMA PRECISE LAP (MISCELLANEOUS) ×3 IMPLANT
CHLORAPREP W/TINT 26ML (MISCELLANEOUS) ×3 IMPLANT
CLAMP CORD UMBIL (MISCELLANEOUS) IMPLANT
CLOSURE WOUND 1/2 X4 (GAUZE/BANDAGES/DRESSINGS) ×1
CLOTH BEACON ORANGE TIMEOUT ST (SAFETY) ×3 IMPLANT
DRSG OPSITE POSTOP 4X10 (GAUZE/BANDAGES/DRESSINGS) ×3 IMPLANT
ELECT REM PT RETURN 9FT ADLT (ELECTROSURGICAL) ×3
ELECTRODE REM PT RTRN 9FT ADLT (ELECTROSURGICAL) ×1 IMPLANT
EXTRACTOR VACUUM KIWI (MISCELLANEOUS) IMPLANT
GLOVE BIO SURGEON STRL SZ7 (GLOVE) ×3 IMPLANT
GLOVE BIOGEL PI IND STRL 7.0 (GLOVE) ×2 IMPLANT
GLOVE BIOGEL PI INDICATOR 7.0 (GLOVE) ×4
GOWN STRL REUS W/TWL LRG LVL3 (GOWN DISPOSABLE) ×6 IMPLANT
GOWN STRL REUS W/TWL XL LVL3 (GOWN DISPOSABLE) ×3 IMPLANT
KIT ABG SYR 3ML LUER SLIP (SYRINGE) IMPLANT
NEEDLE HYPO 22GX1.5 SAFETY (NEEDLE) ×3 IMPLANT
NEEDLE HYPO 25X5/8 SAFETYGLIDE (NEEDLE) IMPLANT
NS IRRIG 1000ML POUR BTL (IV SOLUTION) ×3 IMPLANT
PACK C SECTION WH (CUSTOM PROCEDURE TRAY) ×3 IMPLANT
PAD OB MATERNITY 4.3X12.25 (PERSONAL CARE ITEMS) ×3 IMPLANT
PENCIL SMOKE EVAC W/HOLSTER (ELECTROSURGICAL) ×3 IMPLANT
RTRCTR C-SECT PINK 25CM LRG (MISCELLANEOUS) IMPLANT
SPONGE SURGIFOAM ABS GEL 12-7 (HEMOSTASIS) IMPLANT
STRIP CLOSURE SKIN 1/2X4 (GAUZE/BANDAGES/DRESSINGS) ×2 IMPLANT
SUT PDS AB 0 CTX 60 (SUTURE) IMPLANT
SUT PLAIN 0 NONE (SUTURE) IMPLANT
SUT SILK 0 TIES 10X30 (SUTURE) IMPLANT
SUT VIC AB 0 CT1 36 (SUTURE) ×9 IMPLANT
SUT VIC AB 3-0 CT1 27 (SUTURE) ×2
SUT VIC AB 3-0 CT1 TAPERPNT 27 (SUTURE) ×1 IMPLANT
SUT VIC AB 4-0 KS 27 (SUTURE) IMPLANT
SYR CONTROL 10ML LL (SYRINGE) ×3 IMPLANT
TOWEL OR 17X24 6PK STRL BLUE (TOWEL DISPOSABLE) ×3 IMPLANT
TRAY FOLEY BAG SILVER LF 14FR (SET/KITS/TRAYS/PACK) ×3 IMPLANT

## 2017-11-30 NOTE — Brief Op Note (Signed)
11/30/2017  2:27 AM  PATIENT:  Marveen ReeksErika Gassmann  27 y.o. female  PRE-OPERATIVE DIAGNOSIS:  * No pre-op diagnosis entered *  POST-OPERATIVE DIAGNOSIS:  * No post-op diagnosis entered *  PROCEDURE:  Procedure(s): CESAREAN SECTION (N/A)  SURGEON:  Surgeon(s) and Role:    * Willodean RosenthalHarraway-Smith, Makaya Juneau, MD - Primary  ANESTHESIA:   spinal  EBL:  500 mL   BLOOD ADMINISTERED:none  DRAINS: none   LOCAL MEDICATIONS USED:  MARCAINE     SPECIMEN:  Source of Specimen:  placenta  DISPOSITION OF SPECIMEN:  PATHOLOGY  COUNTS:  YES  TOURNIQUET:  * No tourniquets in log *  DICTATION: .Note written in EPIC  PLAN OF CARE: Admit to inpatient   PATIENT DISPOSITION:  PACU - hemodynamically stable.   Delay start of Pharmacological VTE agent (>24hrs) due to surgical blood loss or risk of bleeding: yes  Complications: none immediate  Bronc Brosseau L. Harraway-Smith, M.D., Evern CoreFACOG

## 2017-11-30 NOTE — Anesthesia Postprocedure Evaluation (Signed)
Anesthesia Post Note  Patient: Haley [name]  Procedure(s) Performed: CESAREAN SECTION (N/A )     Patient location during evaluation: PACU Anesthesia Type: Spinal Level of consciousness: oriented, awake and alert and awake Pain management: pain level controlled Vital Signs Assessment: post-procedure vital signs reviewed and stable Respiratory status: spontaneous breathing, respiratory function stable and patient connected to nasal cannula oxygen Cardiovascular status: blood pressure returned to baseline and stable Postop Assessment: no headache, no backache, no apparent nausea or vomiting, spinal receding and patient able to bend at knees Anesthetic complications: no    Last Vitals:  Vitals:   11/30/17 0400 11/30/17 0415  BP: 126/81 (!) 124/98  Pulse: 93 99  Resp: (!) 21 20  Temp:    SpO2: 98% 100%    Last Pain:  Vitals:   11/30/17 0345  TempSrc: Oral  PainSc:    Pain Goal:                 Cecile HearingStephen Edward Alletta Mattos

## 2017-11-30 NOTE — Anesthesia Procedure Notes (Signed)
Spinal  Patient location during procedure: OR Start time: 11/30/2017 1:46 AM End time: 11/30/2017 1:48 AM Staffing Anesthesiologist: Cecile Hearingurk, Reshaun Briseno Edward, MD Performed: anesthesiologist  Preanesthetic Checklist Completed: patient identified, surgical consent, pre-op evaluation, timeout performed, IV checked, risks and benefits discussed and monitors and equipment checked Spinal Block Patient position: sitting Prep: site prepped and draped and DuraPrep Patient monitoring: continuous pulse ox and blood pressure Approach: midline Location: L3-4 Injection technique: single-shot Needle Needle type: Pencan  Needle gauge: 24 G Assessment Sensory level: T4 Additional Notes Functioning IV was confirmed and monitors were applied. Sterile prep and drape, including hand hygiene, mask and sterile gloves were used. The patient was positioned and the spine was prepped. The skin was anesthetized with lidocaine.  Free flow of clear CSF was obtained prior to injecting local anesthetic into the CSF.  The spinal needle aspirated freely following injection.  The needle was carefully withdrawn.  The patient tolerated the procedure well. Consent was obtained prior to procedure with all questions answered and concerns addressed. Risks including but not limited to bleeding, infection, nerve damage, paralysis, failed block, inadequate analgesia, allergic reaction, high spinal, itching and headache were discussed and the patient wished to proceed.   Haley AranStephen Jonnathan Birman, MD

## 2017-11-30 NOTE — Progress Notes (Signed)
Patient ID: Haley [name], female   DOB: [birth year], 27 y.o.   MRN: [id number] Comfortable  Lying on left side  Vitals:   11/29/17 2300 11/29/17 2330 11/30/17 0000 11/30/17 0030  BP: 132/88 134/80 129/80 (!) 115/53  Pulse: (!) 101 95 94 89  Resp: 16 16 18 18   Temp:      TempSrc:      Weight:      Height:       FHR 150 with small decels UCs not tracing but pattern is worrisome  Dr Erin FullingHarraway-Smith  Consulted to evaluate  I repositioned Toco to see if we can get UCs to trace  Will reevaluate shortly

## 2017-11-30 NOTE — Progress Notes (Signed)
Inpatient Diabetes Program Recommendations  AACE/ADA: New Consensus Statement on Inpatient Glycemic Control (2015)  Target Ranges:  Prepandial:   less than 140 mg/dL      Peak postprandial:   less than 180 mg/dL (1-2 hours)      Critically ill patients:  140 - 180 mg/dL  Results for Haley [name], Haley [name] (MRN [id number]) as of 11/30/2017 08:20  Ref. Range 11/30/2017 00:09 11/30/2017 01:16 11/30/2017 08:15  Glucose-Capillary Latest Ref Range: 65 - 99 mg/dL 409119 (H) 82 811156 (H)   Results for Haley [name], Haley [name] (MRN [id number]) as of 11/30/2017 08:20  Ref. Range 11/29/2017 11:42 11/29/2017 14:04 11/29/2017 15:29 11/29/2017 16:53 11/29/2017 18:00 11/29/2017 18:58 11/29/2017 20:04 11/29/2017 21:16 11/29/2017 22:12 11/29/2017 23:20  Glucose-Capillary Latest Ref Range: 65 - 99 mg/dL 213215 (H) 086179 (H) 578178 (H) 157 (H) 128 (H) 151 (H) 125 (H) 126 (H) 109 (H) 167 (H)  Results for Haley [name], Haley [name] (MRN [id number]) as of 11/30/2017 08:20  Ref. Range 05/03/2017 10:02  Hemoglobin A1C Latest Ref Range: 4.8 - 5.6 % 9.7 (H)   Review of Glycemic Control  Diabetes history: DM2 Outpatient Diabetes medications: NPH 20 units QAM, NPH 60 units QHS, Novolog 25 units with breakfast, 20 units with lunch, and 25 units with supper Current orders for Inpatient glycemic control: Metformin 1000 mg BID  Inpatient Diabetes Program Recommendations: Correction (SSI): While inpatient, please consider ordering Novolog 0-9 units TID with meals and Novolog 0-5 units QHS (using Regular Glycemic Control order set). Outpatient Follow Up: Recommend patient follow up with PCP or Endocrinology regarding DM control.  NOTE: In reviewing chart, noted patient has a history of DM2 and per office note by Dr. Vergie LivingPickens on 05/03/17 "[facility] and she had actual DM2 and not pre-DM and was on insulin for a time but came off it b/c she was able to switch her diet. Last seen in the last year or two."   Thanks, Orlando PennerMarie Aliyanah Rozas, RN, MSN, CDE Diabetes Coordinator Inpatient Diabetes  Program [phone] (Team Pager from 8am to 5pm)

## 2017-11-30 NOTE — Progress Notes (Signed)
Grandmother came to desk crying asking for [facility]. Talked with MD to inquire [facility] MD declined saying patient still too recent after C/S and needs sugar regulation. Patient calm, not upset at all when told she might be discharged tomorrow but not transferred today. Grandmother more calm now. Talked with patient about progressing care to prepare her for possible discharge tomorrow. Walked patient in hall. Tolerated well.

## 2017-11-30 NOTE — Op Note (Signed)
11/30/2017  2:27 AM  PATIENT:  Haley [name]  27 y.o. female  PRE-OPERATIVE DIAGNOSIS:  * No pre-op diagnosis entered *  POST-OPERATIVE DIAGNOSIS:  * No post-op diagnosis entered *  PROCEDURE:  Procedure(s): CESAREAN SECTION (N/A)  SURGEON:  Surgeon(s) and Role:    * Willodean RosenthalHarraway-Smith, Maddelynn Moosman, MD - Primary  ANESTHESIA:   spinal  EBL:  500 mL   BLOOD ADMINISTERED:none  DRAINS: none   LOCAL MEDICATIONS USED:  MARCAINE     SPECIMEN:  Source of Specimen:  placenta  DISPOSITION OF SPECIMEN:  PATHOLOGY  COUNTS:  YES  TOURNIQUET:  * No tourniquets in log *  DICTATION: .Note written in EPIC  PLAN OF [name]: Admit to inpatient   PATIENT DISPOSITION:  PACU - hemodynamically stable.   Delay start of Pharmacological VTE agent (>24hrs) due to surgical blood loss or risk of bleeding: yes  Complications: none immediate  INDICATIONS: Haley [name] is a 27 y.o. G1P1001 at 3746w2d here for cesarean section secondary to the indications listed under preoperative diagnosis; please see preoperative note for further details.  The risks of cesarean section were discussed with the patient including but were not limited to: bleeding which may require transfusion or reoperation; infection which may require antibiotics; injury to bowel, bladder, ureters or other surrounding organs; injury to the fetus; need for additional procedures including hysterectomy in the event of a life-threatening hemorrhage; placental abnormalities wth subsequent pregnancies, incisional problems, thromboembolic phenomenon and other postoperative/anesthesia complications.   The patient concurred with the proposed plan, giving informed written consent for the procedure.    FINDINGS:  Viable female infant in cephalic presentation.  Apgars: pending.  Clear amniotic fluid.  Intact placenta, three vessel cord.  Normal uterus, fallopian tubes and ovaries bilaterally.  PROCEDURE IN DETAIL:  The patient preoperatively received intravenous  antibiotics and had sequential compression devices applied to her lower extremities.  She was then taken to the operating room where spinal anesthesia was administered and was found to be adequate. She was then placed in a dorsal supine position with a leftward tilt, and prepped and draped in a sterile manner.  A foley catheter was placed into her bladder and attached to constant gravity.  After an adequate timeout was performed, a Pfannenstiel skin incision was made with scalpel and carried through to the underlying layer of fascia. The fascia was incised in the midline, and this incision was extended bilaterally using the Mayo scissors.  Kocher clamps were applied to the superior aspect of the fascial incision and the underlying rectus muscles were dissected off bluntly. A similar process was carried out on the inferior aspect of the fascial incision. The rectus muscles were separated in the midline bluntly and the peritoneum was entered bluntly.  Attention was turned to the lower uterine segment where a low transverse hysterotomy incision was made with a scalpel and extended bilaterally bluntly.  The infant was successfully delivered, the cord was clamped and cut and the infant was handed over to awaiting neonatology team. The placenta was delivered manually. Uterine massage was then administered.  The placenta was intact with a three-vessel cord. The uterus was then cleared of clot and debris and exteriorized.  The hysterotomy was closed with 0 Vicryl in a running locked fashion, and an imbricating layer was also placed with the same suture. The uterus was returned to the pelvis. The pelvis was cleared of all clot and debris. Hemostasis was confirmed on all surfaces.  The peritoneum and the muscles were reapproximated using  0 Vicryl with 1 interrupted suture. The fascia was then closed using 0 Vicryl.  The subcutaneous layer was irrigated, then reapproximated with 3-0 vicryl in a running locked fashion, and the  skin was closed with a 4-0 Vicryl subcuticular stitch.  30 cc of 0.5% marcaine was injected into the incision and benzoin and steristrips were applied.  The patient tolerated the procedure well. Sponge, lap, instrument and needle counts were correct x 2.  She was taken to the recovery room in stable condition.   Boyd Litaker L. Harraway-Smith, M.D., Evern Core

## 2017-11-30 NOTE — Progress Notes (Signed)
Patient ID: Haley [name], female   DOB: 1991/05/31, 27 y.o.   MRN: [id number] CTSP for FHR changes consisitnt with rpetitive late decleleration . Cat III FHR noted. The risks of cesarean section discussed with the patient included but were not limited to: bleeding which may require transfusion or reoperation; infection which may require antibiotics; injury to bowel, bladder, ureters or other surrounding organs; injury to the fetus; need for additional procedures including hysterectomy in the event of a life-threatening hemorrhage; placental abnormalities wth subsequent pregnancies, incisional problems, thromboembolic phenomenon and other postoperative/anesthesia complications. The patient concurred with the proposed plan, giving informed written consent for the procedure.   Patient has been NPO and she will remain NPO for procedure. Anesthesia and OR aware. Preoperative prophylactic antibiotics and SCDs ordered on call to the OR.  To OR when ready.  Rolen Conger L. Harraway-Smith, M.D., Evern CoreFACOG

## 2017-11-30 NOTE — Anesthesia Preprocedure Evaluation (Signed)
Anesthesia Evaluation  Patient identified by MRN, date of birth, ID band Patient awake    Reviewed: Allergy & Precautions, NPO status , Patient's Chart, lab work & pertinent test results  Airway Mallampati: II  TM Distance: >3 FB Neck ROM: Full    Dental  (+) Teeth Intact, Dental Advisory Given   Pulmonary neg pulmonary ROS,    Pulmonary exam normal breath sounds clear to auscultation       Cardiovascular negative cardio ROS Normal cardiovascular exam Rhythm:Regular Rate:Normal     Neuro/Psych negative neurological ROS     GI/Hepatic negative GI ROS, Neg liver ROS,   Endo/Other  diabetes, Type 2, Insulin Dependent, Oral Hypoglycemic AgentsObesity   Renal/GU negative Renal ROS     Musculoskeletal negative musculoskeletal ROS (+)   Abdominal   Peds  Hematology  (+) Blood dyscrasia, anemia , Plt 156k   Anesthesia Other Findings Day of surgery medications reviewed with the patient.  Reproductive/Obstetrics (+) Pregnancy                             Anesthesia Physical Anesthesia Plan  ASA: III and emergent  Anesthesia Plan: Spinal   Post-op Pain Management:    Induction:   PONV Risk Score and Plan: 2 and Dexamethasone, Ondansetron and Scopolamine patch - Pre-op  Airway Management Planned:   Additional Equipment:   Intra-op Plan:   Post-operative Plan:   Informed Consent: I have reviewed the patients History and Physical, chart, labs and discussed the procedure including the risks, benefits and alternatives for the proposed anesthesia with the patient or authorized representative who has indicated his/her understanding and acceptance.   Dental advisory given  Plan Discussed with: CRNA, Anesthesiologist and Surgeon  Anesthesia Plan Comments: (Discussed risks and benefits of and differences between spinal and general. Discussed risks of spinal including headache, backache,  failure, bleeding, infection, and nerve damage. Patient consents to spinal. Questions answered. Coagulation studies and platelet count acceptable.)        Anesthesia Quick Evaluation

## 2017-11-30 NOTE — Transfer of Care (Signed)
Immediate Anesthesia Transfer of Care Note  Patient: Haley [name]  Procedure(s) Performed: CESAREAN SECTION (N/A )  Patient Location: PACU  Anesthesia Type:Spinal  Level of Consciousness: awake, alert  and oriented  Airway & Oxygen Therapy: Patient Spontanous Breathing  Post-op Assessment: Report given to RN and Post -op Vital signs reviewed and stable  Post vital signs: Reviewed and stable  Last Vitals:  Vitals:   11/30/17 0100 11/30/17 0131  BP: 106/63 132/75  Pulse: 90 (!) 105  Resp: 16 16  Temp:      Last Pain:  Vitals:   11/29/17 2200  TempSrc: Oral  PainSc:          Complications: No apparent anesthesia complications

## 2017-12-01 ENCOUNTER — Other Ambulatory Visit: Payer: Self-pay

## 2017-12-01 DIAGNOSIS — Z98891 History of uterine scar from previous surgery: Secondary | ICD-10-CM

## 2017-12-01 LAB — GLUCOSE, CAPILLARY
GLUCOSE-CAPILLARY: 162 mg/dL — AB (ref 65–99)
Glucose-Capillary: 141 mg/dL — ABNORMAL HIGH (ref 65–99)

## 2017-12-01 LAB — CULTURE, BETA STREP (GROUP B ONLY)

## 2017-12-01 MED ORDER — IBUPROFEN 600 MG PO TABS: 600.0000 mg | ORAL_TABLET | Freq: Four times a day (QID) | ORAL | 0 refills | Status: DC

## 2017-12-01 MED ORDER — METFORMIN HCL 1000 MG PO TABS: 1000.0000 mg | ORAL_TABLET | Freq: Two times a day (BID) | ORAL | 3 refills | Status: AC

## 2017-12-01 MED ORDER — OXYCODONE-ACETAMINOPHEN 5-325 MG PO TABS: 1.0000 | ORAL_TABLET | Freq: Four times a day (QID) | ORAL | 0 refills | Status: DC | PRN

## 2017-12-01 NOTE — Discharge Summary (Signed)
OB Discharge Summary     Patient Name: Haley [name] DOB: 1990-12-08 MRN: [id number]  Date of admission: 11/29/2017 Delivering MD: Lavonia Drafts   Date of discharge: 12/01/2017  Admitting diagnosis: NON REACTIVE NST Intrauterine pregnancy: [redacted]w[redacted]d    Secondary diagnosis:  Principal Problem:   S/P primary low transverse C-section Active Problems:   Supervision of high-risk pregnancy   Obesity (BMI 30-39.9)   DM (diabetes mellitus), type 2 ([facility]   Alpha thalassemia trait   GBS bacteriuria   Preexisting diabetes complicating pregnancy, antepartum   Polyhydramnios affecting pregnancy in third trimester   Indication for care in labor and delivery, antepartum      Discharge diagnosis: Term Pregnancy Delivered                                                                                                Post partum procedures:none  Augmentation: Pitocin and Foley Balloon  Complications: None  Hospital course:  Induction of Labor With Cesarean Section  27y.o. yo G1P1001 at 310w2das admitted to the hospital 11/29/2017 for induction of labor due to non-reactive NST in the setting of uncontrolled diabetes. Patient had a labor course significant for non-reassuring FHT, and the patient went for cesarean section due to Non-Reassuring FHR, and delivered a Viable infant on 11/30/17 at 02:02 AM via LTCS. Details of operation can be found in separate operative Note.  Patient had an uncomplicated postpartum course, but her baby required CPAP and needed NICU admission, and d/t lack of beds at our hospital, [facility] Patient's blood sugars improved, although somewhat elevated remained less than 180. She is ambulating, tolerating a regular diet, passing flatus, and urinating well at 36-hours s/p C/S. Patient is discharged home in stable condition on 12/01/17, an early discharge per patient's request given that her baby is an  outside hospital.     Physical exam  Vitals:   11/30/17 2132 11/30/17 2347 12/01/17 0614 12/01/17 1346  BP:  122/80 120/74 132/81  Pulse:  84 84 90  Resp:  16 16 16   Temp:  98.4 F (36.9 C) 98.1 F (36.7 C) 98.9 F (37.2 C)  TempSrc:  Oral Oral Oral  SpO2: 99% 99%    Weight:      Height:       General: alert, cooperative and no distress Lochia: appropriate Uterine Fundus: firm Incision: Dressing is clean, dry, and intact DVT Evaluation: No evidence of DVT seen on physical exam. No significant calf/ankle edema. Labs: Lab Results  Component Value Date   WBC 13.8 (H) 11/30/2017   HGB 11.7 (L) 11/30/2017   HCT 35.6 (L) 11/30/2017   MCV 75.3 (L) 11/30/2017   PLT 173 11/30/2017   CMP Latest Ref Rng & Units 11/29/2017  Glucose 65 - 99 mg/dL 224(H)  BUN 6 - 20 mg/dL 10  Creatinine 0.44 - 1.00 mg/dL 0.60  Sodium 135 - 145 mmol/L 132(L)  Potassium 3.5 - 5.1 mmol/L 4.0  Chloride 101 - 111 mmol/L 103  CO2 22 - 32 mmol/L 19(L)  Calcium  8.9 - 10.3 mg/dL 8.9  Total Protein 6.5 - 8.1 g/dL 7.0  Total Bilirubin 0.3 - 1.2 mg/dL 0.6  Alkaline Phos 38 - 126 U/L 119  AST 15 - 41 U/L 13(L)  ALT 14 - 54 U/L 16    Discharge instruction: per After Visit Summary and "Baby and Me Booklet".  After visit meds:  Allergies as of 12/01/2017   No Known Allergies     Medication List    STOP taking these medications   aspirin EC 81 MG tablet   glucose blood test strip Commonly known as:  ACCU-CHEK SMARTVIEW   insulin aspart 100 UNIT/ML injection Commonly known as:  novoLOG   insulin NPH Human 100 UNIT/ML injection Commonly known as:  HUMULIN N,NOVOLIN N     TAKE these medications   ACCU-CHEK FASTCLIX LANCETS Misc 1 Units by Percutaneous route 4 (four) times daily.   ACCU-CHEK NANO SMARTVIEW w/Device Kit 1 kit by Subdermal route as directed. Check blood sugars for fasting, and two hours after breakfast, lunch and dinner (4 checks daily)   ibuprofen 600 MG tablet Commonly known  as:  ADVIL,MOTRIN Take 1 tablet (600 mg total) by mouth every 6 (six) hours.   metFORMIN 1000 MG tablet Commonly known as:  GLUCOPHAGE Take 1 tablet (1,000 mg total) by mouth 2 (two) times daily with a meal.   oxyCODONE-acetaminophen 5-325 MG tablet Commonly known as:  PERCOCET/ROXICET Take 1 tablet by mouth every 6 (six) hours as needed for severe pain.   PRENATAL VITAMIN PO Take 1 tablet by mouth daily.            Discharge Care Instructions  (From admission, onward)        Start     Ordered   12/01/17 0000  Change dressing (specify)    Comments:  Remove dressing 7 days after your surgery. Removing while in the shower will be easier. If you there are any Steri-Strips under dressing, remove those as well.   12/01/17 1416      Diet: carb modified diet  Activity: Advance as tolerated. Pelvic rest for 6 weeks.   Outpatient follow up: 2-week incision check, 4-week PPV Follow up Appt: Future Appointments  Date Time Provider [place]  12/14/2017 10:00 AM [facility] NURSE [facility]  01/04/2018 10:00 AM Donnamae Jude, MD [facility]   Postpartum contraception: Nexplanon  Newborn Data: Live born female  Birth Weight: 7 lb 5.8 oz (3340 g) APGAR: 8, 8  Newborn Delivery   Birth date/time:  11/30/2017 02:02:00 Delivery type:  C-Section, Low Transverse C-section categorization:  Primary     Baby Feeding: Breast Disposition:  NICU at [facility].   12/01/2017 Gailen Shelter, MD

## 2017-12-01 NOTE — Lactation Note (Signed)
Lactation Consultation Note  Patient Name: Haley [name] GBMBO'M Date: 12/01/2017 Reason for consult: Initial assessment;NICU baby;Other (Comment)(baby transferred to [facility] due to full capactiity in NICU 11/29/2017 / D/C today )  Mom noted to be teary when [facility] entered the room. Per mom had just gotten off the phone from the hospital where her baby is.  While LC in the room the 16 Doctor called to report on baby's status and dad made him aware mom is being discharged and plan is to come see baby.  LC reviewed basics for baby being in NICU Per mom has pumped with DEBP x 5 in the last 24 hours with so results.  Feel comfortable with hand expressing.  LC recommended prior to pumping hand express and pump both breast for 15 -20 mins and then  Hand express.  Sore nipple and engorgement prevention and tx reviewed.  Reusable ice packs for when the milk comes in given to mom.  Per mom has Medicaid but is not signed up with [place], ans wasn't aware they go together.  Per mom does not have a DEBP for D/C and plans to buy one.  LC explored options with mom and discussed gift shop rental, or to buy a reliable DEBP ( ex Medela )  And then when visiting baby in NICU take pump kit and plan on using the hospital grade pump in the NICU.  LC encouraged STS and to ask NICU RN.  LC discussed supply and demand and the importance of consistent pumping around the clock.  And reassured mom it all can be a slow process, but if consistent the volume will increase.  Encouraged rest / naps/ plenty of fluids, and nutritious snacks and meals.  Mother informed of post-discharge support and given phone number to the lactation department, including services for phone call assistance; out-patient appointments; and breastfeeding support group. List of other breastfeeding resources in the community given in the handout. Encouraged mother to call for problems or concerns related to breastfeeding.    Maternal Data Does the  patient have breastfeeding experience prior to this delivery?: No  Feeding    LATCH Score                   Interventions Interventions: Breast feeding basics reviewed;DEBP  Lactation Tools Discussed/Used Tools: Pump Breast pump type: Double-Electric Breast Pump Pump Review: Setup, frequency, and cleaning;Milk Storage Initiated by:: DEBP had been set up, per mom pumped 5 times in 24 hours    Consult Status Consult Status: Complete    [place] 12/01/2017, 1:41 PM

## 2017-12-01 NOTE — Discharge Instructions (Signed)

## 2017-12-01 NOTE — Progress Notes (Signed)
Night time blood sugar did not autoflow into computer flowsheet, result when RN checked it was 132.

## 2017-12-01 NOTE — Progress Notes (Signed)
Post Op Day 1, LTCS for NRFHR following IOL for nonreactive NST.  Subjective: no complaints, up ad lib, tolerating PO and + flatus  Objective: Blood pressure 120/74, pulse 84, temperature 98.1 F (36.7 C), temperature source Oral, resp. rate 16, height 5\' 2"  (1.575 m), weight 98.9 kg (218 lb), last menstrual period 03/14/2017, SpO2 99 %, unknown if currently breastfeeding.  Physical Exam:  General: alert, cooperative and no distress Lochia: appropriate Uterine Fundus: firm Incision: healing well, slight clear drainage present DVT Evaluation: No evidence of DVT seen on physical exam. Negative Homan's sign. No cords or calf tenderness. No significant calf/ankle edema.  Recent Labs    11/29/17 1132 11/30/17 0619  HGB 12.7 11.7*  HCT 39.1 35.6*    Assessment/Plan: Plan for discharge tomorrow, Lactation consult and Contraception Nexplanon at follow up visit    LOS: 2 days   Haley ManisSherin [name] 12/01/2017, 11:35 AM   OB FELLOW POSTPARTUM PROGRESS NOTE ATTESTATION I have seen and examined this patient and agree with above documentation in the resident's note.   Patient doing well, has passed flatus, voiding w/o difficulty, and ambulating w/o difficulty. Her pain is well-controlled BP wnl. Incision with dressing in place with small amount of saturation. Fundus firm. CBGs are imrpoving and < 180.  Patient would like to be discharge since her baby is in NICU at outside facility. She is now 36-hours s/p C/S and doing well. Discussed precautions at length, and she voices understanding and feels comfortable with plan. Will have 2-wk PP incision check.   Frederik PearJulie P Degele, MD OB Fellow 2:09 PM

## 2017-12-03 ENCOUNTER — Other Ambulatory Visit: Payer: Medicaid Other

## 2017-12-06 ENCOUNTER — Encounter: Payer: Medicaid Other | Admitting: Obstetrics and Gynecology

## 2017-12-10 ENCOUNTER — Other Ambulatory Visit: Payer: Medicaid Other

## 2017-12-12 ENCOUNTER — Inpatient Hospital Stay (HOSPITAL_COMMUNITY): Payer: Medicaid Other

## 2017-12-13 ENCOUNTER — Encounter: Payer: Medicaid Other | Admitting: Obstetrics & Gynecology

## 2017-12-14 ENCOUNTER — Other Ambulatory Visit: Payer: Medicaid Other | Admitting: *Deleted

## 2017-12-14 MED ORDER — CLINDAMYCIN HCL 300 MG PO CAPS: 300.0000 mg | ORAL_CAPSULE | Freq: Three times a day (TID) | ORAL | 0 refills | Status: DC

## 2017-12-14 MED ORDER — IRON 325 (65 FE) MG PO TABS: 1.0000 | ORAL_TABLET | Freq: Every day | ORAL | 0 refills | Status: DC

## 2017-12-14 NOTE — Progress Notes (Signed)
Agree with nursing staff's documentation of this patient's clinic encounter.  Jaynie CollinsUgonna Neeya Prigmore, MD 12/14/2017 11:51 AM

## 2017-12-14 NOTE — Progress Notes (Signed)
cvlSubjective:     Haley [name] is a 27 y.o. female who presents to the clinic 2 weeks status post Cesarean Section for nonreactive NST. Eating a regular diet without difficulty. Bowel movements are normal. Pain is controlled with current analgesics. Medications being used: prescription NSAID's including ibuprofen (Motrin) and narcotic analgesics including Percocet. She has left side breast pain and warm to touch.   The following portions of the patient's history were reviewed and updated as appropriate: allergies, current medications, past family history, past medical history, past social history, past surgical history and problem list.  Objective:    BP 114/80   Pulse 87   Temp 98.7 F (37.1 C) (Oral)   LMP 03/14/2017 (Exact Date)  General:  no distress  Abdomen: soft, bowel sounds active, non-tender  Incision:   healing well, no drainage, no erythema, no hernia, no seroma, no swelling, no dehiscence, incision well approximated    Left Breast is red and warm to touch.   Assessment:    Doing well postoperatively.   Plan:    1. Continue any current medications. New Rx for possible mastitis. 2. Wound care discussed. 3. Activity restrictions: none 4. Anticipated return to work: 6-8 wks. 5. Follow up: prn    Janit BernMandy Hutchinson, CMA

## 2017-12-17 ENCOUNTER — Ambulatory Visit: Payer: Medicaid Other | Admitting: *Deleted

## 2017-12-17 DIAGNOSIS — Z4889 Encounter for other specified surgical aftercare: Secondary | ICD-10-CM

## 2017-12-17 NOTE — Progress Notes (Signed)
Subjective:     Haley [name] is a 27 y.o. female who presents to the clinic 2 weeks status post LTCS for delivery. Eating a regular diet with difficulty. Bowel movements are normal. The patient is not having any pain.   Objective:    LMP 03/14/2017 (Exact Date)  General:  alert  Abdomen: soft, bowel sounds active, non-tender  Incision:   healing well, superficial separation.      Assessment:    Doing well postoperatively.   Plan:    1. Continue any current medications. 2. Wound care discussed. 3. Wound cleaned with sterile water and steristrips placed over small open area 3. Activity restrictions: none 4. Anticipated return to work: 4 weeks. 5. Follow up: 3 days for wound check.   Janit BernMandy Kristy Schomburg, CMA

## 2017-12-18 NOTE — Progress Notes (Signed)
Agree with nursing staff's documentation of this patient's clinic encounter.  Haley [name] Talent, MD 12/18/2017 8:21 AM

## 2017-12-24 ENCOUNTER — Other Ambulatory Visit: Payer: Medicaid Other | Admitting: *Deleted

## 2017-12-24 NOTE — Progress Notes (Signed)
Subjective:     Marveen Reeksrika Klausner is a 27 y.o. female who presents to the clinic 3 weeks status post cesarean section Eating a regular diet without difficulty. Bowel movements are normal. Pain is controlled without any medications.    Objective:    LMP 03/14/2017 (Exact Date)  General:  alert, cooperative and fatigued  Abdomen: soft, bowel sounds active, non-tender  Incision:   healing well, no drainage, no erythema, no hernia, no seroma, no swelling, no dehiscence, incision well approximated. One small open area approximately 1cm     Assessment:    Doing well postoperatively.   Plan:    1. Continue any current medications. 2. Wound care discussed. Cleaned and placed SteriStrip over open area 3. Activity restrictions: none 4. Anticipated return to work: 2-3 weeks. 5. Follow up: as scheduled  Janit BernMandy Taralyn Ferraiolo, CMA

## 2017-12-25 NOTE — Progress Notes (Signed)
I have reviewed the chart and agree with nursing staff's documentation of this patient's encounter.  Charles Andringa, MD 12/25/2017 2:45 PM    

## 2018-01-04 ENCOUNTER — Ambulatory Visit (INDEPENDENT_AMBULATORY_CARE_PROVIDER_SITE_OTHER): Payer: Medicaid Other | Admitting: Family Medicine

## 2018-01-04 ENCOUNTER — Encounter: Payer: Self-pay | Admitting: Family Medicine

## 2018-01-04 ENCOUNTER — Other Ambulatory Visit (HOSPITAL_COMMUNITY)
Admission: RE | Admit: 2018-01-04 | Discharge: 2018-01-04 | Disposition: A | Discharge status: Home / Self Care | Payer: Medicaid Other | Source: Ambulatory Visit | Attending: Family Medicine | Admitting: Family Medicine

## 2018-01-04 DIAGNOSIS — B3789 Other sites of candidiasis: Secondary | ICD-10-CM

## 2018-01-04 DIAGNOSIS — Z3049 Encounter for surveillance of other contraceptives: Secondary | ICD-10-CM | POA: Diagnosis not present

## 2018-01-04 MED ORDER — FLUCONAZOLE 150 MG PO TABS: 150.0000 mg | ORAL_TABLET | Freq: Every day | ORAL | 2 refills | Status: DC

## 2018-01-04 NOTE — Patient Instructions (Signed)
Etonogestrel implant What is this medicine? ETONOGESTREL (et oh noe JES trel) is a contraceptive (birth control) device. It is used to prevent pregnancy. It can be used for up to 3 years. This medicine may be used for other purposes; ask your health care provider or pharmacist if you have questions. COMMON BRAND NAME(S): Implanon, Nexplanon What should I tell my health care provider before I take this medicine? They need to know if you have any of these conditions: -abnormal vaginal bleeding -blood vessel disease or blood clots -cancer of the breast, cervix, or liver -depression -diabetes -gallbladder disease -headaches -heart disease or recent heart attack -high blood pressure -high cholesterol -kidney disease -liver disease -renal disease -seizures -tobacco smoker -an unusual or allergic reaction to etonogestrel, other hormones, anesthetics or antiseptics, medicines, foods, dyes, or preservatives -pregnant or trying to get pregnant -breast-feeding How should I use this medicine? This device is inserted just under the skin on the inner side of your upper arm by a health care professional. Talk to your pediatrician regarding the use of this medicine in children. Special care may be needed. Overdosage: If you think you have taken too much of this medicine contact a poison control center or emergency room at once. NOTE: This medicine is only for you. Do not share this medicine with others. What if I miss a dose? This does not apply. What may interact with this medicine? Do not take this medicine with any of the following medications: -amprenavir -bosentan -fosamprenavir This medicine may also interact with the following medications: -barbiturate medicines for inducing sleep or treating seizures -certain medicines for fungal infections like ketoconazole and itraconazole -grapefruit juice -griseofulvin -medicines to treat seizures like carbamazepine, felbamate, oxcarbazepine,  phenytoin, topiramate -modafinil -phenylbutazone -rifampin -rufinamide -some medicines to treat HIV infection like atazanavir, indinavir, lopinavir, nelfinavir, tipranavir, ritonavir -St. John's wort This list may not describe all possible interactions. Give your health care provider a list of all the medicines, herbs, non-prescription drugs, or dietary supplements you use. Also tell them if you smoke, drink alcohol, or use illegal drugs. Some items may interact with your medicine. What should I watch for while using this medicine? This product does not protect you against HIV infection (AIDS) or other sexually transmitted diseases. You should be able to feel the implant by pressing your fingertips over the skin where it was inserted. Contact your doctor if you cannot feel the implant, and use a non-hormonal birth control method (such as condoms) until your doctor confirms that the implant is in place. If you feel that the implant may have broken or become bent while in your arm, contact your healthcare provider. What side effects may I notice from receiving this medicine? Side effects that you should report to your doctor or health care professional as soon as possible: -allergic reactions like skin rash, itching or hives, swelling of the face, lips, or tongue -breast lumps -changes in emotions or moods -depressed mood -heavy or prolonged menstrual bleeding -pain, irritation, swelling, or bruising at the insertion site -scar at site of insertion -signs of infection at the insertion site such as fever, and skin redness, pain or discharge -signs of pregnancy -signs and symptoms of a blood clot such as breathing problems; changes in vision; chest pain; severe, sudden headache; pain, swelling, warmth in the leg; trouble speaking; sudden numbness or weakness of the face, arm or leg -signs and symptoms of liver injury like dark yellow or brown urine; general ill feeling or flu-like symptoms;  light-colored   stools; loss of appetite; nausea; right upper belly pain; unusually weak or tired; yellowing of the eyes or skin -unusual vaginal bleeding, discharge -signs and symptoms of a stroke like changes in vision; confusion; trouble speaking or understanding; severe headaches; sudden numbness or weakness of the face, arm or leg; trouble walking; dizziness; loss of balance or coordination Side effects that usually do not require medical attention (report to your doctor or health care professional if they continue or are bothersome): -acne -back pain -breast pain -changes in weight -dizziness -general ill feeling or flu-like symptoms -headache -irregular menstrual bleeding -nausea -sore throat -vaginal irritation or inflammation This list may not describe all possible side effects. Call your doctor for medical advice about side effects. You may report side effects to FDA at 1-800-FDA-1088. Where should I keep my medicine? This drug is given in a hospital or clinic and will not be stored at home. NOTE: This sheet is a summary. It may not cover all possible information. If you have questions about this medicine, talk to your doctor, pharmacist, or health care provider.  2018 Elsevier/Gold Standard (2016-06-01 11:19:22)  

## 2018-01-04 NOTE — Progress Notes (Signed)
Post Partum Exam  Haley [name] is a 27 y.o. [id number] female who presents for a postpartum visit. She is 5 weeks postpartum following a low cervical transverse Cesarean section. I have fully reviewed the prenatal and intrapartum course. The delivery was at 37.2 gestational weeks.  Anesthesia: spinal. Postpartum course has been complicated by incision being slow to heal, mastitis resolved but continues to have left nipple pain. Baby's course has been unremarkable. Baby is feeding by breast. Notes painful nippleon left after pulling off white area. Bleeding Period two days ago, small spotting only today. Bowel function is normal. Bladder function is normal. Patient is not sexually active. Contraception method is Nexplanon. Postpartum depression screening: Score = 3  The following portions of the patient's history were reviewed and updated as appropriate: allergies, current medications, past family history, past medical history, past social history, past surgical history and problem list.   Review of Systems Pertinent items noted in HPI and remainder of comprehensive ROS otherwise negative.    Objective:  Last menstrual period 03/14/2017, unknown if currently breastfeeding.  General:  alert, cooperative and appears stated age   Breasts:  nipple on left is bright pink  Lungs: normal effort  Heart:  regular rate and rhythm  Abdomen: soft, non-tender; bowel sounds normal; no masses,  no organomegaly incision is well healed--small area treated with AgNO3   Reports CBGs 120-160's    Procedure: Patient given informed consent, signed copy in the chart, time out was performed. Pregnancy test was negative. Appropriate time out taken.  Patient's left arm was prepped and draped in the usual sterile fashion.. The ruler used to measure and mark insertion area.  Pt was prepped with alcohol swab and then injected with 3 cc of 1% lidocaine with epinephrine.  Pt was prepped with betadine, Nexplanon removed form  packaging,  Device confirmed in needle, then inserted full length of needle and withdrawn per handbook instructions.  Pt insertion site covered with 4 x 4 and CoBand.   Minimal blood loss.  Pt tolerated the procedure well.   Assessment:    Normal  postpartum exam.   Plan:   1. Contraception: Nexplanon discussed bleeding 2. Needs to return for pap smear 3. Diflucan for nipple yeast 3. Follow up in: 2 months for pap or as needed.

## 2018-01-07 ENCOUNTER — Other Ambulatory Visit: Payer: Self-pay | Admitting: Family Medicine

## 2018-01-07 ENCOUNTER — Other Ambulatory Visit: Payer: Medicaid Other | Admitting: *Deleted

## 2018-01-07 DIAGNOSIS — A599 Trichomoniasis, unspecified: Secondary | ICD-10-CM

## 2018-01-07 DIAGNOSIS — Z3049 Encounter for surveillance of other contraceptives: Secondary | ICD-10-CM

## 2018-01-07 DIAGNOSIS — O9270 Unspecified disorders of lactation: Secondary | ICD-10-CM

## 2018-01-07 LAB — CERVICOVAGINAL ANCILLARY ONLY
Bacterial vaginitis: NEGATIVE
CHLAMYDIA, DNA PROBE: NEGATIVE
Candida vaginitis: NEGATIVE
Neisseria Gonorrhea: NEGATIVE
Trichomonas: POSITIVE — AB

## 2018-01-07 MED ORDER — ETONOGESTREL 68 MG ~~LOC~~ IMPL
68.0000 mg | DRUG_IMPLANT | Freq: Once | SUBCUTANEOUS | Status: AC
  Administered 2018-01-07: 68 mg via SUBCUTANEOUS

## 2018-01-07 MED ORDER — METRONIDAZOLE 500 MG PO TABS: 1000.0000 mg | ORAL_TABLET | Freq: Two times a day (BID) | ORAL | 1 refills | Status: AC

## 2018-01-07 NOTE — Progress Notes (Signed)
I have reviewed the chart and agree with nursing staff's documentation of this patient's encounter.  Jaynie CollinsUgonna Anyanwu, MD 01/07/2018 2:42 PM

## 2018-01-07 NOTE — Progress Notes (Signed)
SUBJECTIVE: Ms. Claudette LawsWatson is approximately 6 weeks postpartum here for assistance with latching baby to breast for feeding.  She has had difficulty latching baby to breast for feeding since delivery.  She states she has always had "flat" nipples and was told by lactation to try a large nipple shield. She has not had any luck with large nipple shield and states she thinks it may not be the correct size. Currently she is pumping and giving breast milk in bottle to baby.    OBJECTIVE: Breast appear to be full but soft.  Nipples appear to be flat but not inverted.   Baby comes on/off breast and refuses to feed  ASSESSMENT: Baby prefers bottle nipple Latch - Too reluctant Audible swallowing - None Type of nipple is - Flat Comfort - Soft, non-tender Hold - Minimal to full assistance given   PLAN: Reviewed hand expression and different breastfeeding positions Recommend smaller size nipple shield and applying breast milk to shield before latching Recommend pumping and/or reverse pressure softening before latching

## 2018-01-07 NOTE — Addendum Note (Signed)
Addended by: Arne ClevelandHUTCHINSON, MANDY J on: 01/07/2018 09:05 AM   Modules accepted: Orders

## 2018-02-19 ENCOUNTER — Telehealth: Payer: Self-pay | Admitting: *Deleted

## 2018-02-19 DIAGNOSIS — N61 Mastitis without abscess: Secondary | ICD-10-CM

## 2018-02-19 MED ORDER — IBUPROFEN 800 MG PO TABS: 800.0000 mg | ORAL_TABLET | Freq: Three times a day (TID) | ORAL | 3 refills | Status: DC | PRN

## 2018-02-19 MED ORDER — DICLOXACILLIN SODIUM 500 MG PO CAPS: 500.0000 mg | ORAL_CAPSULE | Freq: Four times a day (QID) | ORAL | 0 refills | Status: DC

## 2018-02-19 NOTE — Telephone Encounter (Signed)
Pt called in stating she is sure she has mastitis as she is trying to wean breastfeeding. She has a history of producing more breast milk than what is needed. She has a fever of 99, chills, body aches, and breast pain. She is not able to pump more than 1 oz  out of breast. She has tried massage, warm/cool compresses, and pumping more frequently.  Advised pt to continue home treatment but to add in abx and nsaids as prescribed. Sent medications to pt preferred pharmacy

## 2018-02-21 ENCOUNTER — Encounter: Payer: Self-pay | Admitting: Family Medicine

## 2018-02-21 ENCOUNTER — Ambulatory Visit (INDEPENDENT_AMBULATORY_CARE_PROVIDER_SITE_OTHER): Payer: Medicaid Other | Admitting: Family Medicine

## 2018-02-21 ENCOUNTER — Other Ambulatory Visit (HOSPITAL_COMMUNITY)
Admission: RE | Admit: 2018-02-21 | Discharge: 2018-02-21 | Disposition: A | Discharge status: Home / Self Care | Payer: Medicaid Other | Source: Ambulatory Visit | Attending: Family Medicine | Admitting: Family Medicine

## 2018-02-21 VITALS — BP 124/79 | HR 72 | Wt 194.0 lb

## 2018-02-21 DIAGNOSIS — Z0000 Encounter for general adult medical examination without abnormal findings: Secondary | ICD-10-CM

## 2018-02-21 DIAGNOSIS — Z01419 Encounter for gynecological examination (general) (routine) without abnormal findings: Secondary | ICD-10-CM

## 2018-02-21 NOTE — Patient Instructions (Signed)
Preventive Care 18-39 Years, Female Preventive care refers to lifestyle choices and visits with your health care provider that can promote health and wellness. What does preventive care include?  A yearly physical exam. This is also called an annual well check.  Dental exams once or twice a year.  Routine eye exams. Ask your health care provider how often you should have your eyes checked.  Personal lifestyle choices, including: ? Daily care of your teeth and gums. ? Regular physical activity. ? Eating a healthy diet. ? Avoiding tobacco and drug use. ? Limiting alcohol use. ? Practicing safe sex. ? Taking vitamin and mineral supplements as recommended by your health care provider. What happens during an annual well check? The services and screenings done by your health care provider during your annual well check will depend on your age, overall health, lifestyle risk factors, and family history of disease. Counseling Your health care provider may ask you questions about your:  Alcohol use.  Tobacco use.  Drug use.  Emotional well-being.  Home and relationship well-being.  Sexual activity.  Eating habits.  Work and work Statistician.  Method of birth control.  Menstrual cycle.  Pregnancy history.  Screening You may have the following tests or measurements:  Height, weight, and BMI.  Diabetes screening. This is done by checking your blood sugar (glucose) after you have not eaten for a while (fasting).  Blood pressure.  Lipid and cholesterol levels. These may be checked every 5 years starting at age 38.  Skin check.  Hepatitis C blood test.  Hepatitis B blood test.  Sexually transmitted disease (STD) testing.  BRCA-related cancer screening. This may be done if you have a family history of breast, ovarian, tubal, or peritoneal cancers.  Pelvic exam and Pap test. This may be done every 3 years starting at age 38. Starting at age 30, this may be done  every 5 years if you have a Pap test in combination with an HPV test.  Discuss your test results, treatment options, and if necessary, the need for more tests with your health care provider. Vaccines Your health care provider may recommend certain vaccines, such as:  Influenza vaccine. This is recommended every year.  Tetanus, diphtheria, and acellular pertussis (Tdap, Td) vaccine. You may need a Td booster every 10 years.  Varicella vaccine. You may need this if you have not been vaccinated.  HPV vaccine. If you are 39 or younger, you may need three doses over 6 months.  Measles, mumps, and rubella (MMR) vaccine. You may need at least one dose of MMR. You may also need a second dose.  Pneumococcal 13-valent conjugate (PCV13) vaccine. You may need this if you have certain conditions and were not previously vaccinated.  Pneumococcal polysaccharide (PPSV23) vaccine. You may need one or two doses if you smoke cigarettes or if you have certain conditions.  Meningococcal vaccine. One dose is recommended if you are age 68-21 years and a first-year college student living in a residence hall, or if you have one of several medical conditions. You may also need additional booster doses.  Hepatitis A vaccine. You may need this if you have certain conditions or if you travel or work in places where you may be exposed to hepatitis A.  Hepatitis B vaccine. You may need this if you have certain conditions or if you travel or work in places where you may be exposed to hepatitis B.  Haemophilus influenzae type b (Hib) vaccine. You may need this  if you have certain risk factors.  Talk to your health care provider about which screenings and vaccines you need and how often you need them. This information is not intended to replace advice given to you by your health care provider. Make sure you discuss any questions you have with your health care provider. Document Released: 01/09/2002 Document Revised:  08/02/2016 Document Reviewed: 09/14/2015 Elsevier Interactive Patient Education  2018 Elsevier Inc.  

## 2018-02-21 NOTE — Progress Notes (Signed)
  Subjective:     Haley [name] is a 27 y.o. female and is here for a comprehensive physical exam. The patient reports problems - overproducer of milk and having mastitis.  Social History   Socioeconomic History  . Marital status: Single    Spouse name: Not on file  . Number of children: Not on file  . Years of education: Not on file  . Highest education level: Not on file  Occupational History  . Not on file  Social Needs  . Financial resource strain: Not on file  . Food insecurity:    Worry: Not on file    Inability: Not on file  . Transportation needs:    Medical: Not on file    Non-medical: Not on file  Tobacco Use  . Smoking status: Never Smoker  . Smokeless tobacco: Never Used  Substance and Sexual Activity  . Alcohol use: Yes    Comment: prior to known pregnancy  . Drug use: No  . Sexual activity: Yes    Birth control/protection: None  Lifestyle  . Physical activity:    Days per week: Not on file    Minutes per session: Not on file  . Stress: Not on file  Relationships  . Social connections:    Talks on phone: Not on file    Gets together: Not on file    Attends religious service: Not on file    Active member of club or organization: Not on file    Attends meetings of clubs or organizations: Not on file    Relationship status: Not on file  . Intimate partner violence:    Fear of current or ex partner: Not on file    Emotionally abused: Not on file    Physically abused: Not on file    Forced sexual activity: Not on file  Other Topics Concern  . Not on file  Social History Narrative  . Not on file   Health Maintenance  Topic Date Due  . PNEUMOCOCCAL POLYSACCHARIDE VACCINE (1) 05/10/1993  . FOOT EXAM  05/10/2001  . OPHTHALMOLOGY EXAM  05/10/2001  . URINE MICROALBUMIN  05/10/2001  . TETANUS/TDAP  05/10/2010  . PAP SMEAR  05/10/2012  . INFLUENZA VACCINE  06/27/2017  . HEMOGLOBIN A1C  11/02/2017  . HIV Screening  Completed    The following portions  of the patient's history were reviewed and updated as appropriate: allergies, current medications, past family history, past medical history, past social history, past surgical history and problem list.  Review of Systems Pertinent items noted in HPI and remainder of comprehensive ROS otherwise negative.   Objective:    BP 124/79   Pulse 72   Wt 194 lb (88 kg)   LMP 03/14/2017 (Exact Date)   BMI 35.48 kg/m  General appearance: alert, cooperative and appears stated age Head: Normocephalic, without obvious abnormality, atraumatic Lungs: normal effort Breasts: normal appearance, no masses or tenderness notes clogged duct in left breast and pink nipples Heart: regular rate and rhythm Abdomen: normal findings: soft, non-tender Pelvic: cervix normal in appearance, external genitalia normal and vagina normal without discharge Extremities: extremities normal, atraumatic, no cyanosis or edema Neurologic: Grossly normal    Assessment:    Healthy female exam.      Plan:     Well woman exam with routine gynecological exam - Plan: Cytology - PAP  Return in 1 year (on 02/22/2019).  See After Visit Summary for Counseling Recommendations

## 2018-02-25 LAB — CYTOLOGY - PAP
Adequacy: ABSENT
DIAGNOSIS: NEGATIVE

## 2018-06-21 IMAGING — US US MFM OB FOLLOW-UP
1 series · 13 of 28 positions shown · non-contrast
Comparison: none

[Series 1: us mfm ob follow-up · 47 acquisitions, 13 frames shown]
[im 2/47]
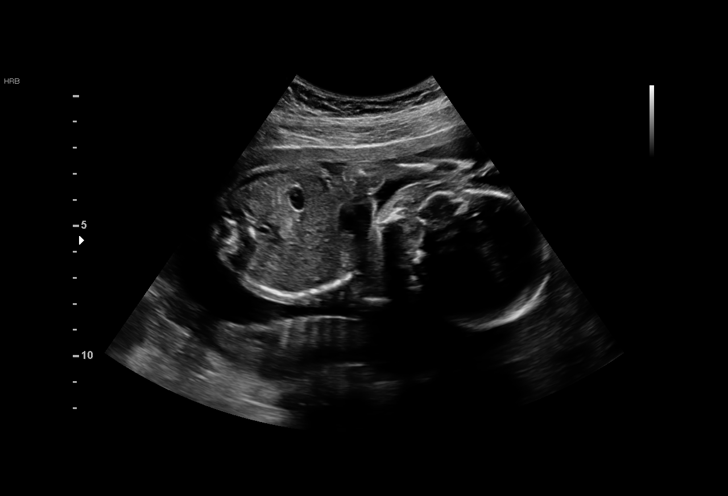
[im 6/47]
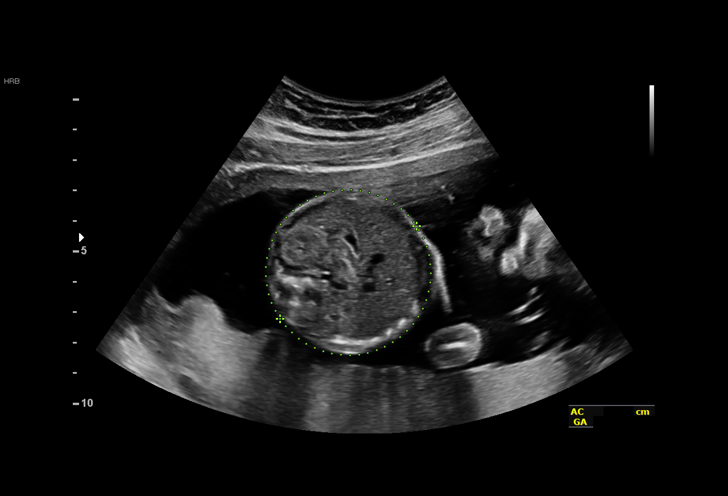
[im 9/47]
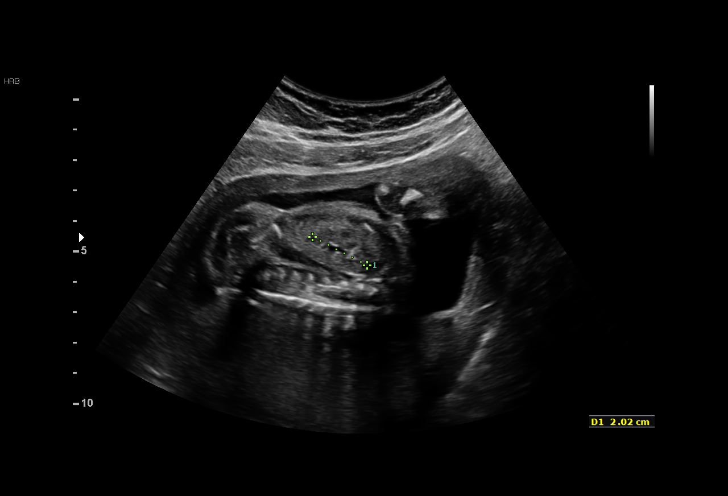
[im 12/47]
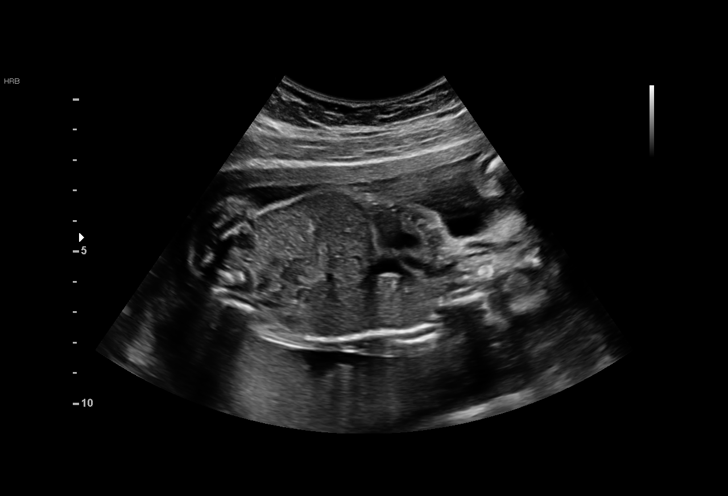
[im 16/47]
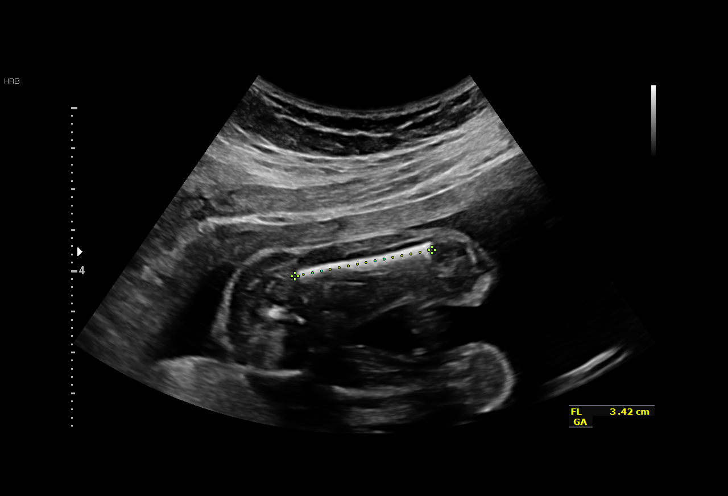
[im 19/47]
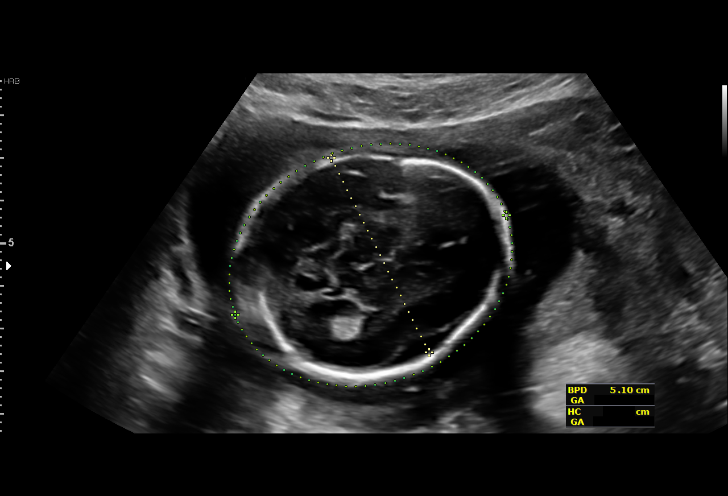
[im 24/47]
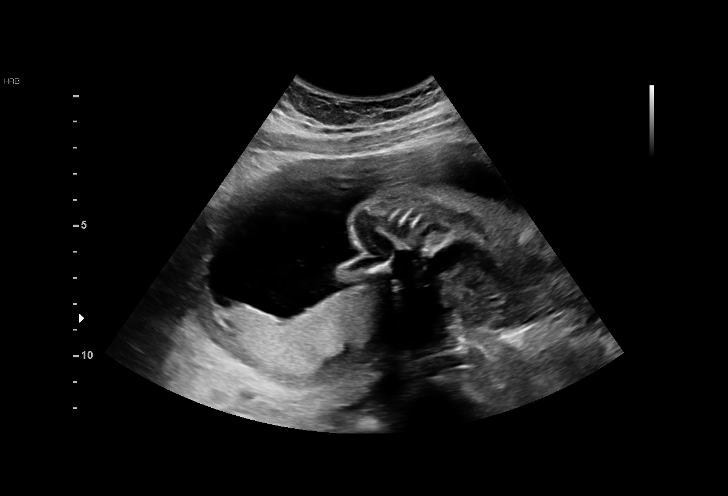
[im 28/47]
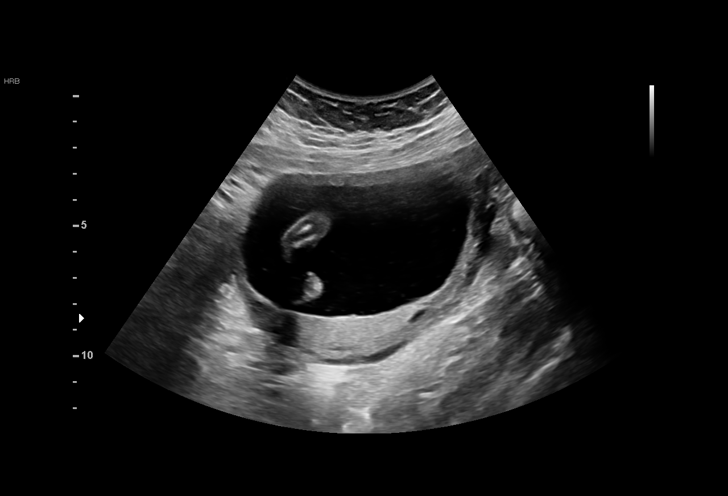
[im 31/47]
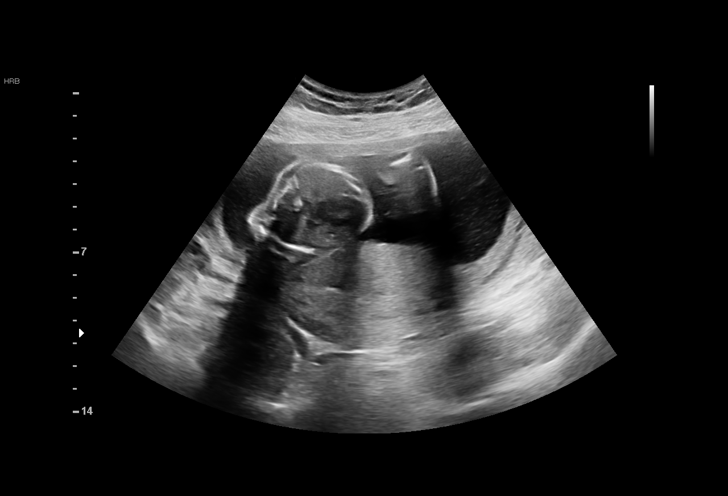
[im 35/47]
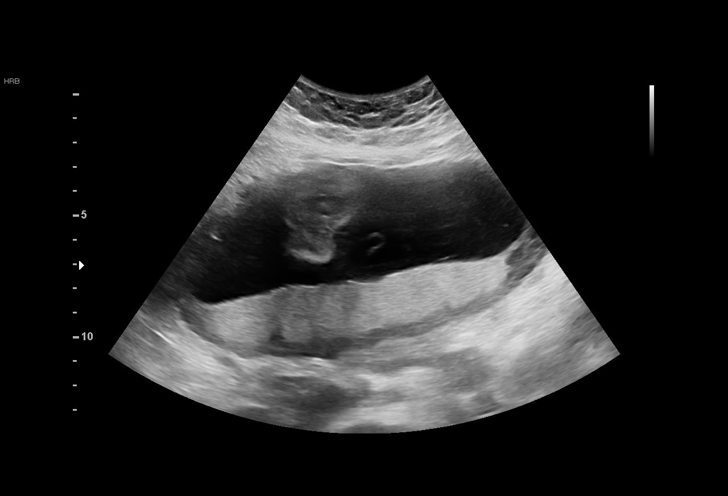
[im 38/47]
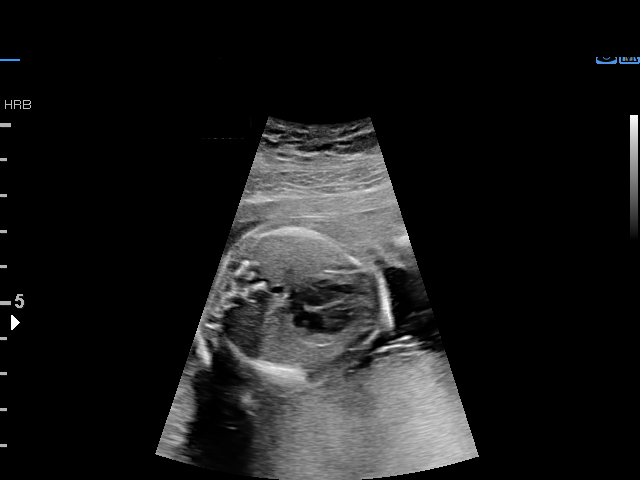
[im 41/47]
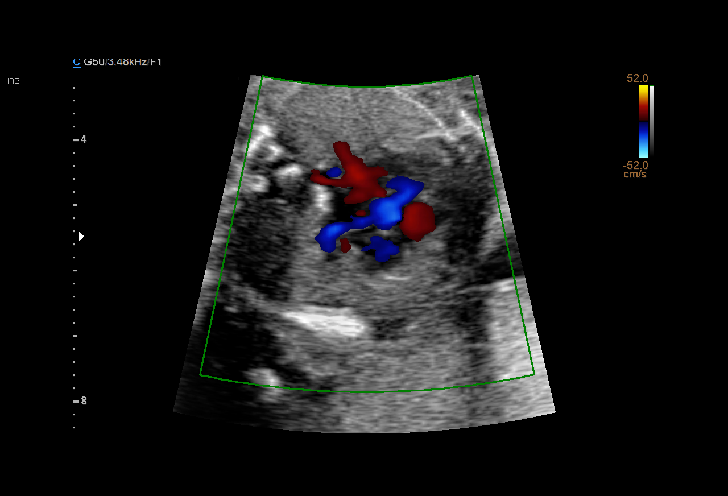
[im 45/47]
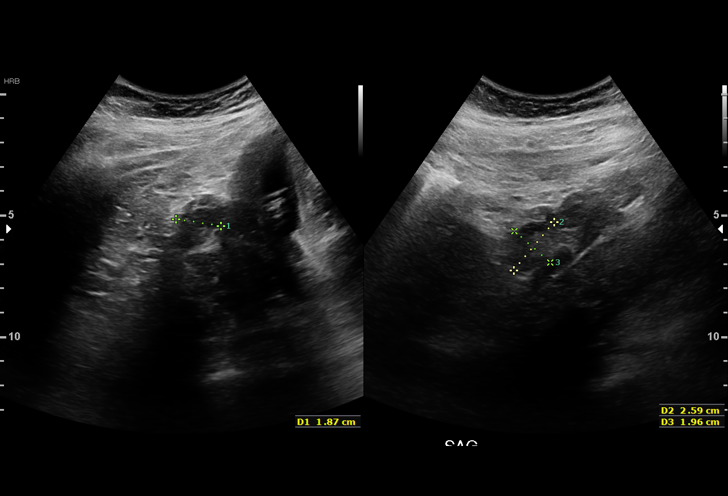

[13 of 28 positions shown; findings below may reference images not displayed]

1  TAMEUR TYARTI           [id number]      [id number]     [id number]
Indications

20 weeks gestation of pregnancy
Obesity complicating pregnancy, second
trimester
Maternal thalassemia complicating
pregnancy in second trimester (alpha
thalassemia trait)
Pre-existing diabetes, type 2, in pregnancy,
second trimester (insulin, metformin)
Encounter for other antenatal screening
follow-up
OB History

Blood Type:            Height:  5'2"   Weight (lb):  187       BMI:
Gravidity:    1
Fetal Evaluation

Num Of Fetuses:     1
Fetal Heart         158
Rate(bpm):
Cardiac Activity:   Observed
Presentation:       Transverse, head to maternal left
Placenta:           Posterior, above cervical os
P. Cord Insertion:  Previously Visualized

Amniotic Fluid
AFI FV:      Subjectively within normal limits

Largest Pocket(cm)
5.9
Biometry

BPD:      51.1  mm     G. Age:  21w 4d         74  %    CI:        71.33   %    70 - 86
FL/HC:      17.6   %    15.9 -
HC:      192.7  mm     G. Age:  21w 4d         70  %    HC/AC:      1.14        1.06 -
AC:      169.6  mm     G. Age:  22w 0d         77  %    FL/BPD:     66.5   %
FL:         34  mm     G. Age:  20w 5d         35  %    FL/AC:      20.0   %    20 - 24
HUM:      32.5  mm     G. Age:  20w 6d         49  %

Est. FW:     421  gm    0 lb 15 oz      54  %
Gestational Age

LMP:           20w 6d        Date:  03/14/17                 EDD:   12/19/17
U/S Today:     21w 3d                                        EDD:   12/15/17
Best:          20w 6d     Det. By:  LMP  (03/14/17)          EDD:   12/19/17
Anatomy

Cranium:               Appears normal         Aortic Arch:            Previously seen
Cavum:                 Appears normal         Ductal Arch:            Previously seen
Ventricles:            Appears normal         Diaphragm:              Appears normal
Choroid Plexus:        Previously seen        Stomach:                Appears normal, left
sided
Cerebellum:            Previously seen        Abdomen:                Appears normal
Posterior Fossa:       Previously seen        Abdominal Wall:         Previously seen
Nuchal Fold:           Previously seen        Cord Vessels:           Previously seen
Face:                  Profile appears        Kidneys:                Appear normal
normal
Lips:                  Appears normal,        Bladder:                Appears normal
orbits prev vis
Thoracic:              Appears normal         Spine:                  Previously seen
Heart:                 Appears normal         Upper Extremities:      Previously seen
(4CH, axis, and situs
RVOT:                  Appears normal         Lower Extremities:      Previously seen
LVOT:                  Appears normal

Other:  Fetus appears to be a female. Parents do not wish to know sex of
fetus.LT Heel prev visualized. Technically difficult due to fetal position.
Cervix Uterus Adnexa

Cervix
Length:           3.09  cm.
Normal appearance by transabdominal scan.

Uterus
No abnormality visualized.

Left Ovary
No adnexal mass visualized.

Right Ovary
Within normal limits.

Cul De Sac:   No free fluid seen.

Adnexa:       No abnormality visualized.
Impression

Singleton intrauterine pregnancy at 20+6 weeks, here for
anatomic survey. Patient has alpha thalassemia trait and type
2 diabetes
Review of the anatomy shows no sonographic markers for
aneuploidy or structural anomalies
Amniotic fluid volume is normal
Estimated fetal weight is 421g which is growth in the 54th
percentile
Recommendations

Recommend evaluations every 4 weeks due to DM, and
antepartum testing from 30-32 weeks

## 2018-10-06 IMAGING — US US MFM FETAL BPP W/O NON-STRESS
1 series · 15 of 28 positions shown · non-contrast
Comparison: none

[Series 1: us mfm fetal bpp w/o non-stress · 40 acquisitions, 15 frames shown]
[im 1/40]
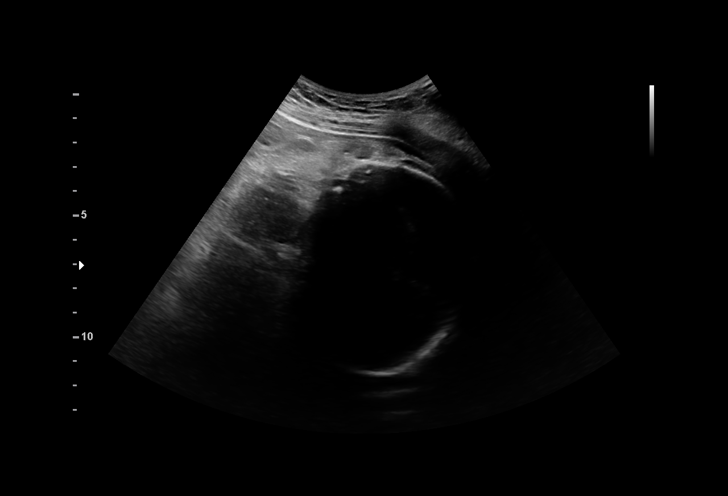
[im 3/40]
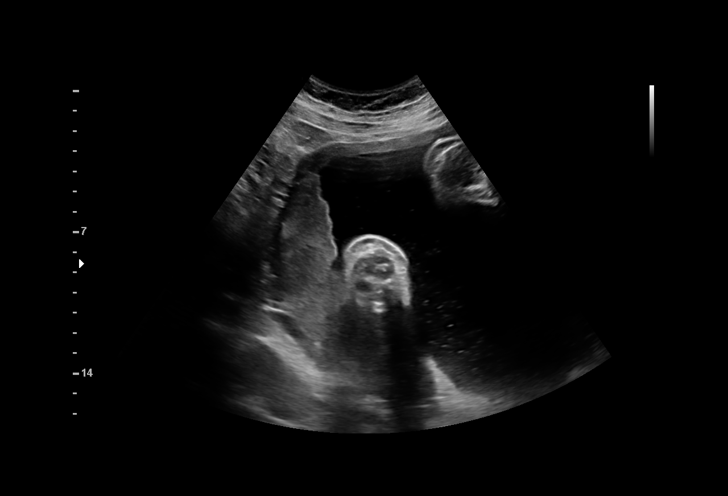
[im 6/40]
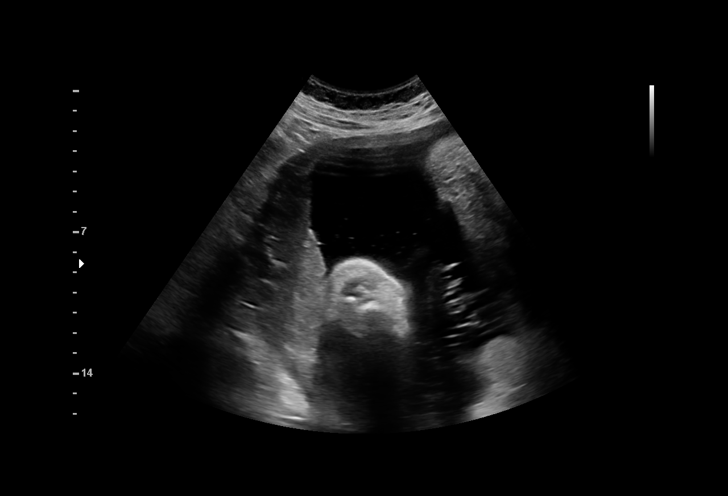
[im 9/40]
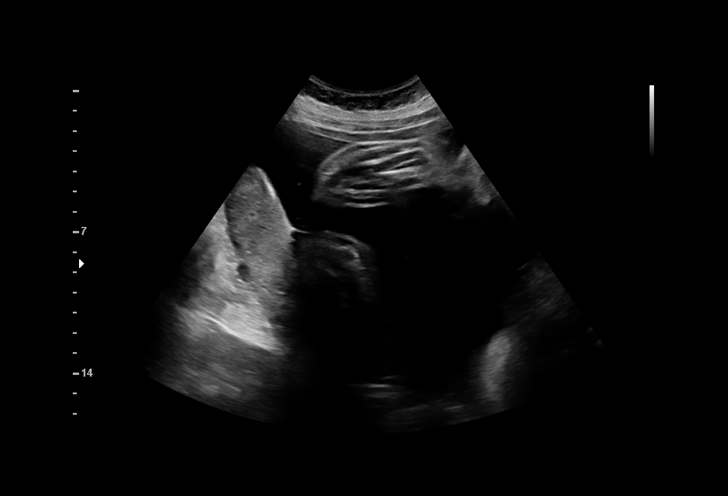
[im 12/40]
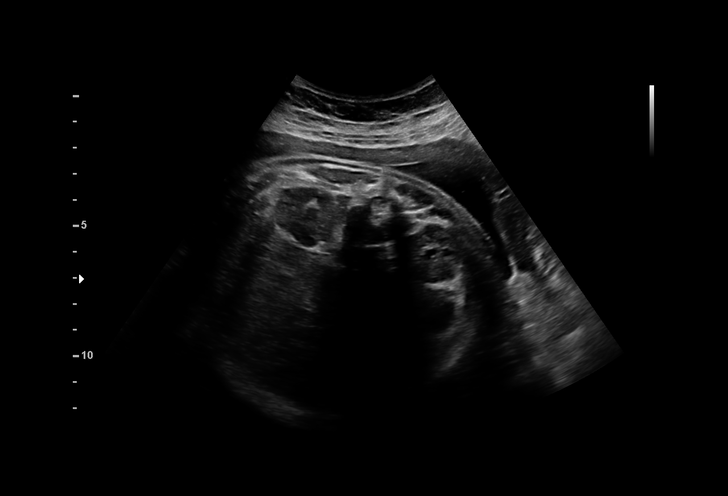
[im 15/40]
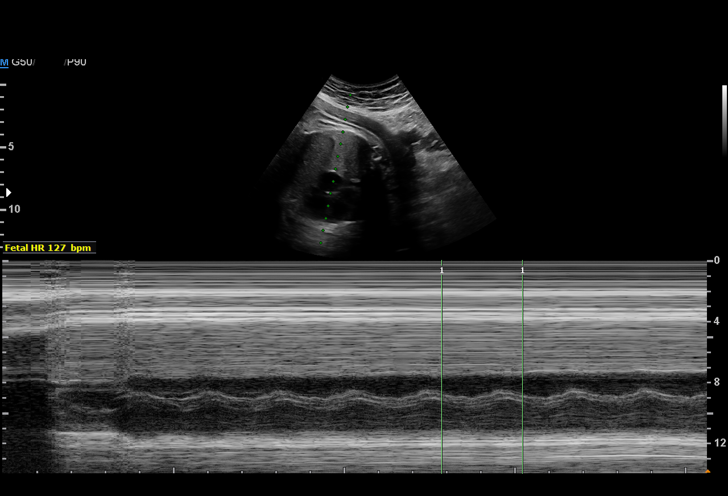
[im 18/40]
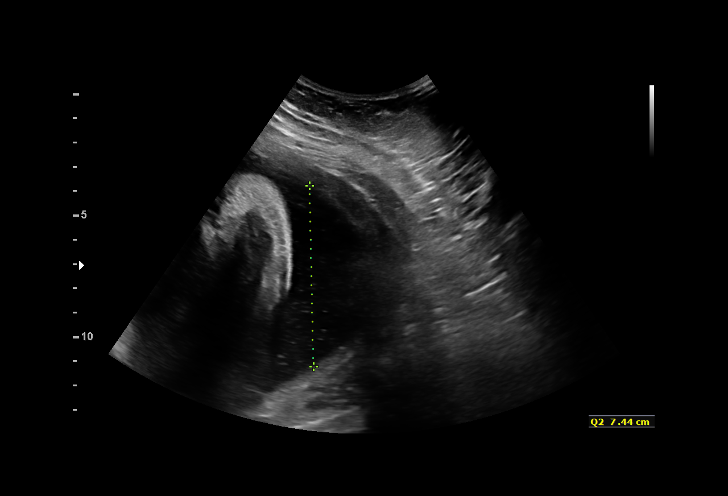
[im 21/40]
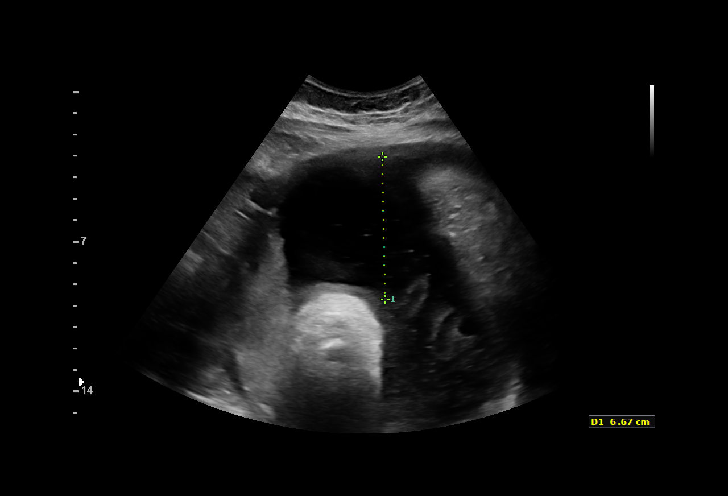
[im 22/40]
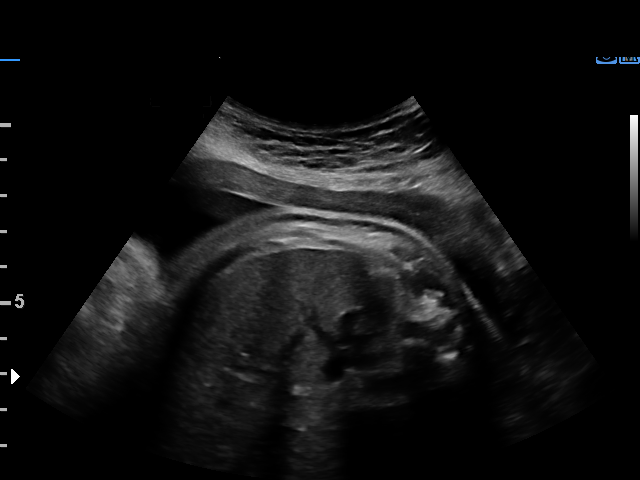
[im 25/40]
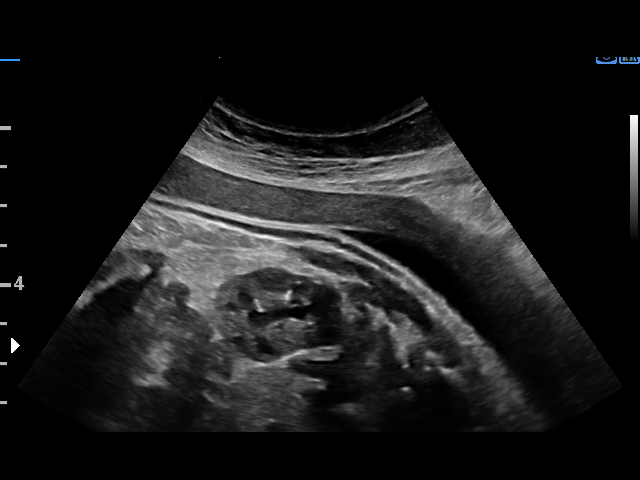
[im 28/40]
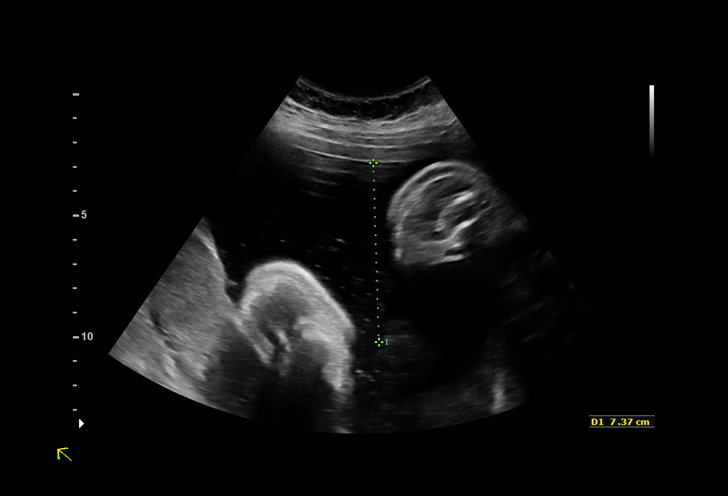
[im 31/40]
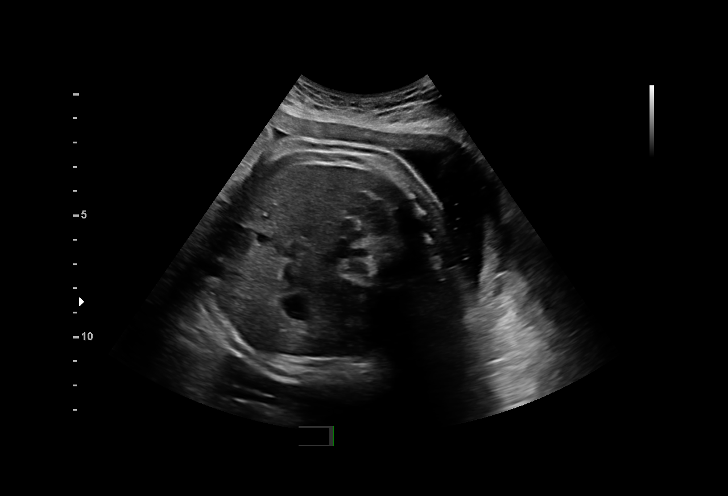
[im 34/40]
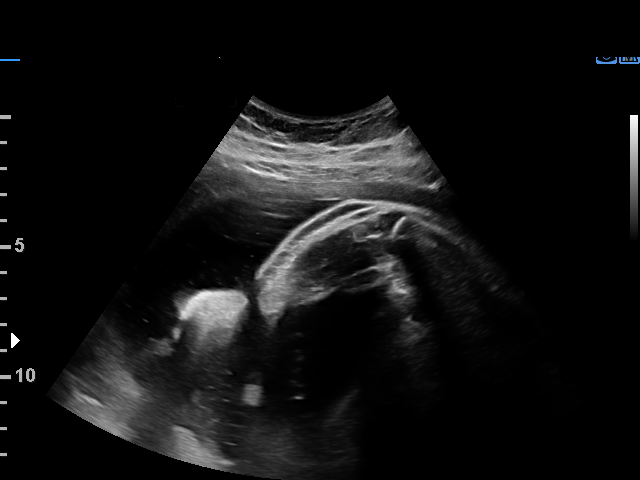
[im 37/40]
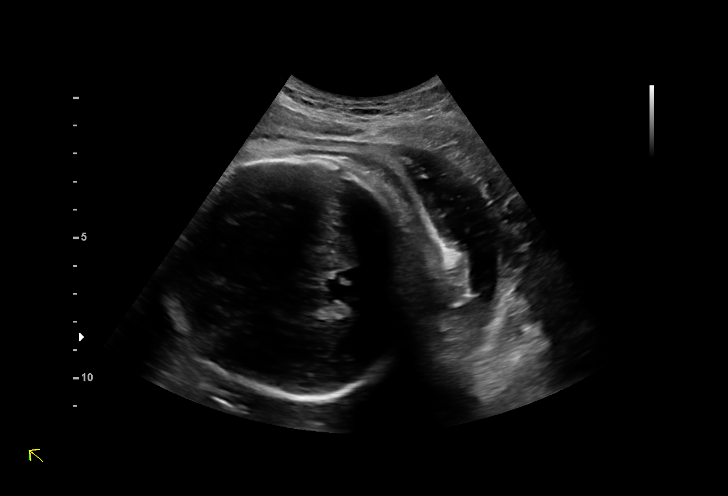
[im 40/40]
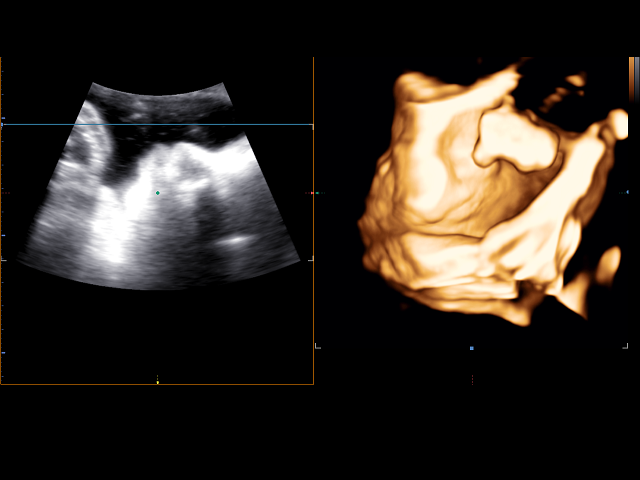

[15 of 28 positions shown; findings below may reference images not displayed]

1  RADDY ROY BERCINTA                [id number]      [id number]     [id number]
Indications

36 weeks gestation of pregnancy
Maternal thalassemia complicating
pregnancy in third trimester(Alpha
thalassemia trait)
Pre-existing diabetes, type 2, in pregnancy,
third trimester(insulin, metformin)
Encounter for other antenatal screening
follow-up
OB History

Blood Type:            Height:  5'2"   Weight (lb):  187       BMI:
Gravidity:    1
Fetal Evaluation

Num Of Fetuses:     1
Fetal Heart         127
Rate(bpm):
Cardiac Activity:   Observed
Presentation:       Cephalic
Placenta:           Posterior, fundal above cervical os
P. Cord Insertion:  Previously Visualized

Amniotic Fluid
AFI FV:      Subjectively upper-normal

AFI Sum(cm)     %Tile       Largest Pocket(cm)
23.5            90

RUQ(cm)       RLQ(cm)       LUQ(cm)        LLQ(cm)
6.93
Biophysical Evaluation

Amniotic F.V:   Pocket => 2 cm two         F. Tone:        Observed
planes
F. Movement:    Observed                   Score:          [DATE]
F. Breathing:   Observed
Gestational Age

LMP:           36w 1d        Date:  03/14/17                 EDD:   12/19/17
Best:          36w 1d     Det. By:  LMP  (03/14/17)          EDD:   12/19/17
Cervix Uterus Adnexa

Cervix
Not visualized (advanced GA >86wks)
Impression

IUP at 36+1 weeks with type 2 DM
Normal amniotic fluid
BPP [DATE]
Recommendations

Continue antenatal testing.
Recommend delivery by 39 weeks if maternal and fetal status
remain reassuring.

## 2019-01-06 ENCOUNTER — Telehealth: Payer: Self-pay

## 2019-01-06 DIAGNOSIS — Z8632 Personal history of gestational diabetes: Secondary | ICD-10-CM

## 2019-01-06 NOTE — Telephone Encounter (Signed)
Patient had diabetes with her pregnancy. She is requesting a refill to see Dr.Matthew endo to have her diabetes recheck.

## 2019-06-05 ENCOUNTER — Emergency Department (INDEPENDENT_AMBULATORY_CARE_PROVIDER_SITE_OTHER)
Admission: EM | Admit: 2019-06-05 | Discharge: 2019-06-05 | Disposition: A | Discharge status: Home / Self Care | Payer: Medicaid Other | Source: Home / Self Care | Attending: Family Medicine | Admitting: Family Medicine

## 2019-06-05 ENCOUNTER — Emergency Department (INDEPENDENT_AMBULATORY_CARE_PROVIDER_SITE_OTHER): Payer: Medicaid Other

## 2019-06-05 ENCOUNTER — Other Ambulatory Visit: Payer: Self-pay

## 2019-06-05 ENCOUNTER — Encounter: Payer: Self-pay | Admitting: Emergency Medicine

## 2019-06-05 DIAGNOSIS — R319 Hematuria, unspecified: Secondary | ICD-10-CM

## 2019-06-05 DIAGNOSIS — R300 Dysuria: Secondary | ICD-10-CM

## 2019-06-05 DIAGNOSIS — N10 Acute tubulo-interstitial nephritis: Secondary | ICD-10-CM

## 2019-06-05 DIAGNOSIS — R109 Unspecified abdominal pain: Secondary | ICD-10-CM

## 2019-06-05 LAB — POCT URINALYSIS DIP (MANUAL ENTRY)
Bilirubin, UA: NEGATIVE
Glucose, UA: 500 mg/dL — AB
Nitrite, UA: NEGATIVE
Protein Ur, POC: 100 mg/dL — AB
Spec Grav, UA: 1.025 (ref 1.010–1.025)
Urobilinogen, UA: 0.2 E.U./dL
pH, UA: 5.5 (ref 5.0–8.0)

## 2019-06-05 MED ORDER — CIPROFLOXACIN HCL 500 MG PO TABS: 500.0000 mg | ORAL_TABLET | Freq: Two times a day (BID) | ORAL | 0 refills | Status: DC

## 2019-06-05 NOTE — ED Provider Notes (Signed)
[name]    CSN: [id number] Arrival date & time: 06/05/19  0911     History   Chief Complaint Chief Complaint  Patient presents with   Dysuria    HPI Haley [name] is a 28 y.o. female.   Patient complains of onset of throbbing right low back ache two days ago. Last night she developed urinary frequency without dysuria, and mild bladder pressure.  Today her right back ache has radiated to her right lower abdomen.  She denies hematuria, but has noted her urine to be brownish.  Patient's last menstrual period was 05/18/2019.  She denies pelvic pain or vaginal discharge.  She denies fevers, chills, and sweats, and no nausea/vomiting.  She denies history of kidney stones or family history of kidney stones.  The history is provided by the patient.    Past Medical History:  Diagnosis Date   Alpha thalassemia trait 05/11/2017   Diabetes mellitus without complication [facility])     Patient Active Problem List   Diagnosis Date Noted   Alpha thalassemia trait 05/11/2017   Obesity (BMI 30-39.9) 05/03/2017   DM (diabetes mellitus), type 2 ([place]) 05/03/2017    Past Surgical History:  Procedure Laterality Date   CESAREAN SECTION N/A 11/30/2017   Procedure: CESAREAN SECTION;  Surgeon: Lavonia Drafts, MD;  Location: [place];  Service: Obstetrics;  Laterality: N/A;   NO PAST SURGERIES      OB History    Gravida  1   Para  1   Term  1   Preterm  0   AB  0   Living  1     SAB  0   TAB  0   Ectopic  0   Multiple  0   Live Births  1            Home Medications    Prior to Admission medications   Medication Sig Start Date End Date Taking? Authorizing Provider  ACCU-CHEK FASTCLIX LANCETS MISC 1 Units by Percutaneous route 4 (four) times daily. 05/14/17   Aletha Halim, MD  Blood Glucose Monitoring Suppl (ACCU-CHEK NANO SMARTVIEW) w/Device KIT 1 kit by Subdermal route as directed. Check blood sugars for fasting, and two hours  after breakfast, lunch and dinner (4 checks daily) 05/14/17   Aletha Halim, MD  ciprofloxacin (CIPRO) 500 MG tablet Take 1 tablet (500 mg total) by mouth 2 (two) times daily. 06/05/19   Kandra Nicolas, MD  Ferrous Sulfate (IRON) 325 (65 Fe) MG TABS Take 1 tablet (325 mg total) by mouth daily. 12/14/17   Anyanwu, Sallyanne Havers, MD  ibuprofen (ADVIL,MOTRIN) 600 MG tablet Take 1 tablet (600 mg total) by mouth every 6 (six) hours. 12/01/17   Degele, Jenne Pane, MD  ibuprofen (ADVIL,MOTRIN) 800 MG tablet Take 1 tablet (800 mg total) by mouth 3 (three) times daily with meals as needed for headache, moderate pain or cramping. 02/19/18   Anyanwu, Sallyanne Havers, MD  metFORMIN (GLUCOPHAGE) 1000 MG tablet Take 1 tablet (1,000 mg total) by mouth 2 (two) times daily with a meal. 12/01/17   Degele, Jenne Pane, MD    Family History Family History  Problem Relation Age of Onset   Diabetes Mother    Healthy Father     Social History Social History   Tobacco Use   Smoking status: Never Smoker   Smokeless tobacco: Never Used  Substance Use Topics   Alcohol use: Yes    Comment: prior to known pregnancy  Drug use: No     Allergies   Patient has no known allergies.   Review of Systems Review of Systems  Constitutional: Negative for activity change, appetite change, chills, diaphoresis, fatigue and fever.  HENT: Negative.   Eyes: Negative.   Respiratory: Negative.   Cardiovascular: Negative.   Gastrointestinal: Positive for abdominal pain. Negative for abdominal distention, blood in stool, constipation, diarrhea, nausea, rectal pain and vomiting.  Genitourinary: Positive for flank pain, frequency and urgency. Negative for difficulty urinating, dysuria, genital sores, hematuria, pelvic pain, vaginal bleeding, vaginal discharge and vaginal pain.  Neurological: Negative for headaches.     Physical Exam Triage Vital Signs ED Triage Vitals  Enc Vitals Group     BP 06/05/19 1110 125/87     Pulse Rate  06/05/19 1110 89     Resp --      Temp 06/05/19 1110 98.6 F (37 C)     Temp Source 06/05/19 1110 Oral     SpO2 06/05/19 1110 100 %     Weight 06/05/19 1111 172 lb (78 kg)     Height 06/05/19 1111 _0  (1.575 m)     Head Circumference --      Peak Flow --      Pain Score 06/05/19 1110 4     Pain Loc --      Pain Edu? --      Excl. in [place]? --    No data found.  Updated Vital Signs BP 125/87 (BP Location: Right Arm)    Pulse 89    Temp 98.6 F (37 C) (Oral)    Ht _1  (1.575 m)    Wt 78 kg    LMP 05/18/2019    SpO2 100%    BMI 31.46 kg/m   Visual Acuity Right Eye Distance:   Left Eye Distance:   Bilateral Distance:    Right Eye Near:   Left Eye Near:    Bilateral Near:     Physical Exam Vitals signs and nursing note reviewed.  Constitutional:      General: She is not in acute distress.    Appearance: She is obese.  HENT:     Head: Normocephalic.     Nose: Nose normal.     Mouth/Throat:     Mouth: Mucous membranes are moist.     Pharynx: Oropharynx is clear.  Eyes:     Conjunctiva/sclera: Conjunctivae normal.     Pupils: Pupils are equal, round, and reactive to light.  Neck:     Musculoskeletal: Neck supple.  Cardiovascular:     Heart sounds: Normal heart sounds.  Pulmonary:     Breath sounds: Normal breath sounds.       Comments: Right flank tenderness to palpation.   Abdominal:     General: Abdomen is flat. Bowel sounds are normal. There is no distension.     Palpations: Abdomen is soft. There is no hepatomegaly, splenomegaly or mass.     Tenderness: There is abdominal tenderness in the right lower quadrant, periumbilical area and suprapubic area. There is right CVA tenderness and guarding. There is no left CVA tenderness or rebound. Positive signs include McBurney's sign.       Comments: Tenderness right flank to right lower abdomen and suprapubic area.  Musculoskeletal:     Right lower leg: No edema.     Left lower leg: No edema.  Lymphadenopathy:      Cervical: No cervical adenopathy.  Skin:    General: Skin is  warm and dry.  Neurological:     Mental Status: She is alert.      UC Treatments / Results  Labs (all labs ordered are listed, but only abnormal results are displayed) Labs Reviewed  POCT URINALYSIS DIP (MANUAL ENTRY) - Abnormal; Notable for the following components:      Result Value   Clarity, UA cloudy (*)    Glucose, UA =500 (*)    Ketones, POC UA >= (160) (*)    Blood, UA large (*)    Protein Ur, POC =100 (*)    Leukocytes, UA Trace (*)    All other components within normal limits  URINE CULTURE  POCT CBC W AUTO DIFF ([facility]):  WBC 10.6; LY 15.2; MO 3.7; GR 81.1; Hgb 13.6; Platelets 190   POCT URINE PREGNANCY negative    EKG   Radiology Ct Renal Stone Study  Result Date: 06/05/2019 CLINICAL DATA:  RIGHT flank pain and hematuria for 2 days EXAM: CT ABDOMEN AND PELVIS WITHOUT CONTRAST TECHNIQUE: Multidetector CT imaging of the abdomen and pelvis was performed following the standard protocol without IV contrast. Sagittal and coronal MPR images reconstructed from axial data set. No oral contrast administered. COMPARISON:  None FINDINGS: Lower chest: Lung bases clear Hepatobiliary: Gallbladder and liver normal appearance Pancreas: Normal appearance Spleen: Normal appearance. Small splenule medial to inferior spleen. Adrenals/Urinary Tract: Adrenal glands normal appearance. Minimal cortical scarring at upper pole RIGHT kidney. Kidneys, ureters, and bladder otherwise normal appearance. No urinary tract calcification or dilatation. Stomach/Bowel: Normal appendix. Stomach distended by food debris. Bowel loops unremarkable. Vascular/Lymphatic: Aorta normal caliber. No adenopathy. Few pelvic phleboliths. Reproductive: Uterus and ovaries/adnexa normal appearance. Other: No free air or free fluid. No hernia or acute inflammatory process. Musculoskeletal: Unremarkable IMPRESSION: No acute intra-abdominal or intrapelvic  abnormalities. Electronically Signed   By: Lavonia Dana M.D.   On: 06/05/2019 13:21    Procedures Procedures (including critical care time)  Medications Ordered in UC Medications - No data to display  Initial Impression / Assessment and Plan / UC Course  I have reviewed the triage vital signs and the nursing notes.  Pertinent labs & imaging results that were available during my care of the patient were reviewed by me and considered in my medical decision making (see chart for details).    Urine culture pending.  Begin Cipro 562m BID for one week. Followup with Family Doctor if not improved in one week.    Final Clinical Impressions(s) / UC Diagnoses   Final diagnoses:  Dysuria  Acute pyelonephritis     Discharge Instructions     Increase fluid intake.  May take Tylenol or ibuprofen for pain.  If symptoms become significantly worse during the night or over the weekend, proceed to the local emergency room.     ED Prescriptions    Medication Sig Dispense Auth. Provider   ciprofloxacin (CIPRO) 500 MG tablet Take 1 tablet (500 mg total) by mouth 2 (two) times daily. 14 tablet BKandra Nicolas MD        BKandra Nicolas MD 06/06/19 [phone]

## 2019-06-05 NOTE — Discharge Instructions (Addendum)
Increase fluid intake.  May take Tylenol or ibuprofen for pain.  If symptoms become significantly worse during the night or over the weekend, proceed to the local emergency room.

## 2019-06-05 NOTE — ED Triage Notes (Signed)
LBP, dysuria x 2 days

## 2019-06-07 LAB — URINE CULTURE
MICRO NUMBER:: 650298
SPECIMEN QUALITY:: ADEQUATE

## 2019-06-08 ENCOUNTER — Telehealth: Payer: Self-pay | Admitting: Emergency Medicine

## 2019-06-08 NOTE — Telephone Encounter (Signed)
Left VM to call tomorrow for status update; may need to change rx.

## 2019-06-09 NOTE — Telephone Encounter (Signed)
Left message to call for follow up.

## 2019-06-09 NOTE — Telephone Encounter (Signed)
Pt called and stated that she is feeling much better.  Per Tilden Fossa, PAC note, she is ok on cipro if feeling better.

## 2019-06-30 LAB — POCT CBC W AUTO DIFF (K'VILLE URGENT CARE)

## 2021-01-17 ENCOUNTER — Other Ambulatory Visit: Payer: Self-pay

## 2021-01-17 ENCOUNTER — Ambulatory Visit (INDEPENDENT_AMBULATORY_CARE_PROVIDER_SITE_OTHER): Payer: Medicaid Other | Admitting: Podiatry

## 2021-01-17 DIAGNOSIS — M2012 Hallux valgus (acquired), left foot: Secondary | ICD-10-CM

## 2021-01-17 DIAGNOSIS — E119 Type 2 diabetes mellitus without complications: Secondary | ICD-10-CM

## 2021-01-17 DIAGNOSIS — M2011 Hallux valgus (acquired), right foot: Secondary | ICD-10-CM

## 2021-01-17 NOTE — Progress Notes (Signed)
   HPI: 30 y.o. female presenting today as a new patient for evaluation of a bilateral foot exam to the lower extremities.  Patient was recently diagnosed with diabetes by her PCP and recommended a routine foot exam.  She states that she does not have any symptoms or issues with her feet.  Otherwise she is healthy.  She presents for further treatment and evaluation  Past Medical History:  Diagnosis Date  . Alpha thalassemia trait 05/11/2017  . Diabetes mellitus without complication [facility])      Physical Exam: General: The patient is alert and oriented x3 in no acute distress.  Dermatology: Skin is warm, dry and supple bilateral lower extremities. Negative for open lesions or macerations.  Vascular: Palpable pedal pulses bilaterally. No edema or erythema noted. Capillary refill within normal limits.  Neurological: Epicritic and protective threshold grossly intact bilaterally.   Musculoskeletal Exam: Range of motion within normal limits to all pedal and ankle joints bilateral. Muscle strength 5/5 in all groups bilateral.  Clinical evidence of hallux valgus bunion deformity noted to the bilateral great toes  Assessment: 1.  Diabetes mellitus type 2; uncomplicated 2.  Hallux valgus deformity bilateral; asymptomatic   Plan of Care:  1. Patient evaluated.  2.  Comprehensive diabetic foot evaluation was performed today.  Patient has no concerns at the moment 3.  Recommend wearing good supportive shoe gear even around the house.  Patient admits to walking barefoot at the house.  Recommend that she discontinues 4.  Return to clinic as needed      Felecia Shelling, DPM [facility]  Dr. Felecia Shelling, DPM    [address] [place], [place] [address]                Office [phone]  Fax [phone]

## 2021-02-17 ENCOUNTER — Other Ambulatory Visit: Payer: Self-pay

## 2021-02-17 ENCOUNTER — Ambulatory Visit (INDEPENDENT_AMBULATORY_CARE_PROVIDER_SITE_OTHER): Payer: Medicaid Other | Admitting: Family Medicine

## 2021-02-17 ENCOUNTER — Other Ambulatory Visit (HOSPITAL_COMMUNITY)
Admission: RE | Admit: 2021-02-17 | Discharge: 2021-02-17 | Disposition: A | Discharge status: Home / Self Care | Payer: Medicaid Other | Source: Ambulatory Visit | Attending: Family Medicine | Admitting: Family Medicine

## 2021-02-17 ENCOUNTER — Ambulatory Visit: Payer: Self-pay

## 2021-02-17 ENCOUNTER — Encounter: Payer: Self-pay | Admitting: Family Medicine

## 2021-02-17 VITALS — BP 122/78 | HR 102 | Wt 173.0 lb

## 2021-02-17 DIAGNOSIS — E119 Type 2 diabetes mellitus without complications: Secondary | ICD-10-CM | POA: Diagnosis not present

## 2021-02-17 DIAGNOSIS — O24312 Unspecified pre-existing diabetes mellitus in pregnancy, second trimester: Secondary | ICD-10-CM | POA: Diagnosis not present

## 2021-02-17 DIAGNOSIS — Z3480 Encounter for supervision of other normal pregnancy, unspecified trimester: Secondary | ICD-10-CM | POA: Diagnosis not present

## 2021-02-17 DIAGNOSIS — O0990 Supervision of high risk pregnancy, unspecified, unspecified trimester: Secondary | ICD-10-CM | POA: Insufficient documentation

## 2021-02-17 DIAGNOSIS — O34219 Maternal care for unspecified type scar from previous cesarean delivery: Secondary | ICD-10-CM

## 2021-02-17 MED ORDER — ASPIRIN EC 81 MG PO TBEC: 81.0000 mg | DELAYED_RELEASE_TABLET | Freq: Every day | ORAL | 3 refills | Status: AC

## 2021-02-17 MED ORDER — NOVOLOG FLEXPEN 100 UNIT/ML ~~LOC~~ SOPN
10.0000 [IU] | PEN_INJECTOR | Freq: Three times a day (TID) | SUBCUTANEOUS | 11 refills | Status: DC
Start: 2021-02-17 — End: 2021-04-12

## 2021-02-17 MED ORDER — INSULIN GLARGINE 100 UNIT/ML SOLOSTAR PEN: 20.0000 [IU] | PEN_INJECTOR | Freq: Every day | SUBCUTANEOUS | 11 refills | Status: DC

## 2021-02-17 NOTE — Progress Notes (Signed)
DATING AND VIABILITY SONOGRAM   Haley [name] is a 30 y.o. year old G2P1001 with LMP Patient's last menstrual period was 11/05/2020 (approximate). which would correlate to  [redacted]w[redacted]d weeks gestation.  She has regular menstrual cycles.   She is here today for a confirmatory initial sonogram.    GESTATION: SINGLETON yes     FETAL ACTIVITY:          Heart rate         158          The fetus is active.   GESTATIONAL AGE AND  BIOMETRICS:  Gestational criteria: Estimated Date of Delivery: 08/12/21 by LMP now at [redacted]w[redacted]d  Previous Scans:0  BPD           2.69cm         14.5weeks                                                   AVERAGE EGA(BY THIS SCAN):  14.5 weeks  WORKING EDD( LMP ):  08/12/2021     TECHNICIAN COMMENTS:  Patient informed that the ultrasound is considered a limited obstetric ultrasound and is not intended to be a complete ultrasound exam.  Patient also informed that the ultrasound is not being completed with the intent of assessing for fetal or placental anomalies or any pelvic abnormalities. Explained that the purpose of today's ultrasound is to assess for fetal heart rate.  Patient acknowledges the purpose of the exam and the limitations of the study.           A copy of this report including all images has been saved and backed up to a second source for retrieval if needed. All measures and details of the anatomical scan, placentation, fluid volume and pelvic anatomy are contained in that report.  Scheryl Marten 02/17/2021 2:35 PM

## 2021-02-17 NOTE — Progress Notes (Signed)
Subjective:   Haley [name] is a 30 y.o. G2P1001 at 40w6dby LMP being seen today for her first obstetrical visit.  Her obstetrical history is significant for pre-existing diabetes and previous c-section. Patient does intend to breast feed. Pregnancy history fully reviewed.  Patient reports no complaints.  HISTORY: OB History  Gravida Para Term Preterm AB Living  2 1 1  0 0 1  SAB IAB Ectopic Multiple Live Births  0 0 0 0 1    # Outcome Date GA Lbr Len/2nd Weight Sex Delivery Anes PTL Lv  2 Current           1 Term 11/30/17 321w2d7 lb 5.8 oz (3.34 kg) F CS-LTranv Spinal  LIV     Name: Haley [name]   Apgar1: 8  Apgar5: 8   Last pap smear was  01/2018 and was normal Past Medical History:  Diagnosis Date  . Alpha thalassemia trait 05/11/2017  . [facility]   Past Surgical History:  Procedure Laterality Date  . CESAREAN SECTION N/A 11/30/2017   Procedure: CESAREAN SECTION;  Surgeon: HaLavonia DraftsMD;  [facility] Service: Obstetrics;  Laterality: N/A;  . NO PAST SURGERIES     Family History  Problem Relation Age of Onset  . Diabetes Mother   . Healthy Father    Social History   Tobacco Use  . Smoking status: Never Smoker  . Smokeless tobacco: Never Used  Vaping Use  . Vaping Use: Never used  Substance Use Topics  . Alcohol use: Yes    Comment: prior to known pregnancy  . Drug use: No   No Known Allergies Current Outpatient Medications on File Prior to Visit  Medication Sig Dispense Refill  . ACCU-CHEK FASTCLIX LANCETS MISC 1 Units by Percutaneous route 4 (four) times daily. 100 each 12  . Blood Glucose Monitoring Suppl (ACCU-CHEK NANO SMARTVIEW) w/Device KIT 1 kit by Subdermal route as directed. Check blood sugars for fasting, and two hours after breakfast, lunch and dinner (4 checks daily) 1 kit 0  . metFORMIN (GLUCOPHAGE) 1000 MG tablet Take 1 tablet (1,000 mg total) by mouth 2 (two) times  daily with a meal. 120 tablet 3   No current facility-administered medications on file prior to visit.     Exam   Vitals:   02/17/21 1404  BP: 122/78  Pulse: (!) 102  Weight: 173 lb (78.5 kg)   Fetal Heart Rate (bpm): 158  System: General: well-developed, well-nourished female in no acute distress   Skin: normal coloration and turgor, no rashes   Neurologic: oriented, normal, negative, normal mood   Extremities: normal strength, tone, and muscle mass, ROM of all joints is normal   HEENT PERRLA, extraocular movement intact and sclera clear, anicteric   Mouth/Teeth mucous membranes moist, pharynx normal without lesions and dental hygiene good   Neck supple and no masses   Cardiovascular: regular rate and rhythm   Respiratory:  no respiratory distress, normal breath sounds   Abdomen: soft, non-tender; bowel sounds normal; no masses,  no organomegaly  Declined pelvic due to having daughter with her today--needs breast and pap at next visit.   Assessment:   Pregnancy: G2P1001 Patient Active Problem List   Diagnosis Date Noted  . Previous cesarean section complicating pregnancy, antepartum condition or complication [id number]. Supervision of other normal pregnancy, antepartum 02/17/2021  . Alpha thalassemia trait 05/11/2017  . Obesity (BMI 30-39.9) 05/03/2017  .  DM (diabetes mellitus), type 2 ([place]) 05/03/2017  . Pilonidal cyst with abscess 10/23/2011     Plan:  1. Supervision of other normal pregnancy, antepartum - US OB Limited; Future - CBC/D/Plt+RPR+Rh+ABO+Rub Ab... - Culture, OB Urine - GC/Chlamydia probe amp ([place])not at [facility] - [place] MFM OB DETAIL +14 WK; Future  2. Type 2 diabetes mellitus without complication, without long-term current use of insulin (HCC) Baseline labs ASA Needs to f/u with optho--reports last exam 6 months ago and normal. Will need fetal echo On metformin will continue, reports Fasting CBGs 180-200--begin Insulin - Hemoglobin A1c -  TSH - Protein / creatinine ratio, urine - Comprehensive metabolic panel - aspirin EC 81 MG tablet; Take 1 tablet (81 mg total) by mouth daily.  Dispense: 90 tablet; Refill: 3 - [place] MFM OB DETAIL +14 WK; Future - Ambulatory referral to Pediatric Cardiology - insulin aspart (NOVOLOG FLEXPEN) 100 UNIT/ML FlexPen; Inject 10 Units into the skin 3 (three) times daily with meals.  Dispense: 15 mL; Refill: 11 - insulin glargine (LANTUS) 100 UNIT/ML Solostar Pen; Inject 20 Units into the skin at bedtime.  Dispense: 15 mL; Refill: 11  3. Previous cesarean section complicating pregnancy, antepartum condition or complication Unsure about plan--discussed options of TOLAC and RCS--to consider Revisit at later date   Initial labs drawn. Continue prenatal vitamins. Genetic Screening discussed, NIPS: declined. Ultrasound discussed; fetal anatomic survey: ordered. Problem list reviewed and updated. The nature of [place] with multiple MDs and other Advanced Practice Providers was explained to patient; also emphasized that residents, students are part of our team. Routine obstetric precautions reviewed. Return in about 2 weeks (around 03/03/2021).

## 2021-02-18 DIAGNOSIS — O24112 Pre-existing diabetes mellitus, type 2, in pregnancy, second trimester: Secondary | ICD-10-CM | POA: Insufficient documentation

## 2021-02-18 DIAGNOSIS — O24319 Unspecified pre-existing diabetes mellitus in pregnancy, unspecified trimester: Secondary | ICD-10-CM | POA: Insufficient documentation

## 2021-02-18 LAB — PROTEIN / CREATININE RATIO, URINE
Creatinine, Urine: 48.7 mg/dL
Protein, Ur: 6.3 mg/dL
Protein/Creat Ratio: 129 mg/g creat (ref 0–200)

## 2021-02-18 LAB — CBC/D/PLT+RPR+RH+ABO+RUB AB...
Antibody Screen: NEGATIVE
Basophils Absolute: 0 10*3/uL (ref 0.0–0.2)
Basos: 0 %
EOS (ABSOLUTE): 0.1 10*3/uL (ref 0.0–0.4)
Eos: 1 %
HCV Ab: <0.1 s/co ratio (ref 0.0–0.9)
HIV Screen 4th Generation wRfx: NONREACTIVE
Hematocrit: 37.5 % (ref 34.0–46.6)
Hemoglobin: 12.4 g/dL (ref 11.1–15.9)
Hepatitis B Surface Ag: NEGATIVE
Immature Grans (Abs): 0.1 10*3/uL (ref 0.0–0.1)
Immature Granulocytes: 1 %
Lymphocytes Absolute: 1.5 10*3/uL (ref 0.7–3.1)
Lymphs: 17 %
MCH: 25.1 pg — ABNORMAL LOW (ref 26.6–33.0)
MCHC: 33.1 g/dL (ref 31.5–35.7)
MCV: 76 fL — ABNORMAL LOW (ref 79–97)
Monocytes Absolute: 0.3 10*3/uL (ref 0.1–0.9)
Monocytes: 4 %
Neutrophils Absolute: 6.8 10*3/uL (ref 1.4–7.0)
Neutrophils: 77 %
Platelets: 195 10*3/uL (ref 150–450)
RBC: 4.95 x10E6/uL (ref 3.77–5.28)
RDW: 14.5 % (ref 11.7–15.4)
RPR Ser Ql: NONREACTIVE
Rh Factor: POSITIVE
Rubella Antibodies, IGG: 1.8 index (ref >0.99)
WBC: 8.8 10*3/uL (ref 3.4–10.8)

## 2021-02-18 LAB — COMPREHENSIVE METABOLIC PANEL
ALT: 11 IU/L (ref 0–32)
AST: 12 IU/L (ref 0–40)
Albumin/Globulin Ratio: 1.4 (ref 1.2–2.2)
Albumin: 4.1 g/dL (ref 3.9–5.0)
Alkaline Phosphatase: 51 IU/L (ref 44–121)
BUN/Creatinine Ratio: 15 (ref 9–23)
BUN: 8 mg/dL (ref 6–20)
Bilirubin Total: <0.2 mg/dL (ref 0.0–1.2)
CO2: 17 mmol/L — ABNORMAL LOW (ref 20–29)
Calcium: 9.7 mg/dL (ref 8.7–10.2)
Chloride: 97 mmol/L (ref 96–106)
Creatinine, Ser: 0.53 mg/dL — ABNORMAL LOW (ref 0.57–1.00)
Globulin, Total: 3 g/dL (ref 1.5–4.5)
Glucose: 243 mg/dL — ABNORMAL HIGH (ref 65–99)
Potassium: 4.4 mmol/L (ref 3.5–5.2)
Sodium: 137 mmol/L (ref 134–144)
Total Protein: 7.1 g/dL (ref 6.0–8.5)
eGFR: 128 mL/min/{1.73_m2} (ref >59)

## 2021-02-18 LAB — TSH: TSH: 0.979 u[IU]/mL (ref 0.450–4.500)

## 2021-02-18 LAB — HEMOGLOBIN A1C
Est. average glucose Bld gHb Est-mCnc: 206 mg/dL
Hgb A1c MFr Bld: 8.8 % — ABNORMAL HIGH (ref 4.8–5.6)

## 2021-02-18 LAB — HCV INTERPRETATION

## 2021-02-21 LAB — GC/CHLAMYDIA PROBE AMP (~~LOC~~) NOT AT ARMC
Chlamydia: NEGATIVE
Comment: NEGATIVE
Comment: NORMAL
Neisseria Gonorrhea: NEGATIVE

## 2021-02-24 ENCOUNTER — Encounter: Payer: Self-pay | Admitting: Family Medicine

## 2021-02-24 LAB — URINE CULTURE, OB REFLEX

## 2021-02-24 LAB — CULTURE, OB URINE

## 2021-02-24 MED ORDER — AMOXICILLIN 500 MG PO CAPS: 500.0000 mg | ORAL_CAPSULE | Freq: Three times a day (TID) | ORAL | 2 refills | Status: DC

## 2021-02-24 NOTE — Addendum Note (Signed)
Addended by: Reva Bores on: 02/24/2021 07:46 PM   Modules accepted: Orders

## 2021-03-03 ENCOUNTER — Ambulatory Visit (INDEPENDENT_AMBULATORY_CARE_PROVIDER_SITE_OTHER): Payer: Medicaid Other | Admitting: Family Medicine

## 2021-03-03 ENCOUNTER — Other Ambulatory Visit: Payer: Self-pay

## 2021-03-03 VITALS — BP 112/76 | HR 96 | Wt 177.0 lb

## 2021-03-03 DIAGNOSIS — E119 Type 2 diabetes mellitus without complications: Secondary | ICD-10-CM

## 2021-03-03 DIAGNOSIS — O0990 Supervision of high risk pregnancy, unspecified, unspecified trimester: Secondary | ICD-10-CM

## 2021-03-03 DIAGNOSIS — O34219 Maternal care for unspecified type scar from previous cesarean delivery: Secondary | ICD-10-CM

## 2021-03-03 DIAGNOSIS — O24312 Unspecified pre-existing diabetes mellitus in pregnancy, second trimester: Secondary | ICD-10-CM

## 2021-03-03 MED ORDER — INSULIN GLARGINE 100 UNIT/ML SOLOSTAR PEN: 25.0000 [IU] | PEN_INJECTOR | Freq: Every day | SUBCUTANEOUS | 11 refills | Status: DC

## 2021-03-03 MED ORDER — GLUCAGON 1 MG/0.2ML ~~LOC~~ SOAJ: 1.0000 mg | SUBCUTANEOUS | 1 refills | Status: AC | PRN

## 2021-03-03 NOTE — Progress Notes (Signed)
   PRENATAL VISIT NOTE  Subjective:  Haley [name] is a 30 y.o. G2P1001 at [redacted]w[redacted]d being seen today for ongoing prenatal care.  She is currently monitored for the following issues for this high-risk pregnancy and has Obesity (BMI 30-39.9); DM (diabetes mellitus), type 2 (HCC); Alpha thalassemia trait; GBS bacteriuria; Pilonidal cyst with abscess; Previous cesarean section complicating pregnancy, antepartum condition or complication; Supervision of other normal pregnancy, antepartum; and Pre-existing diabetes mellitus in pregnancy on their problem list.  Patient reports no complaints.  Contractions: Not present. Vag. Bleeding: None.   . Denies leaking of fluid.   The following portions of the patient's history were reviewed and updated as appropriate: allergies, current medications, past family history, past medical history, past social history, past surgical history and problem list.   Objective:   Vitals:   03/03/21 0825  BP: 112/76  Pulse: 96  Weight: 177 lb (80.3 kg)    Fetal Status: Fetal Heart Rate (bpm): 150         General:  Alert, oriented and cooperative. Patient is in no acute distress.  Skin: Skin is warm and dry. No rash noted.   Cardiovascular: Normal heart rate noted  Respiratory: Normal respiratory effort, no problems with respiration noted  Abdomen: Soft, gravid, appropriate for gestational age.  Pain/Pressure: Present     Pelvic: Cervical exam deferred        Extremities: Normal range of motion.  Edema: None  Mental Status: Normal mood and affect. Normal behavior. Normal judgment and thought content.   Assessment and Plan:  Pregnancy: G2P1001 at [redacted]w[redacted]d 1. Pre-existing diabetes mellitus during pregnancy in second trimester FBS 131-171 2 hour post meals 86-266 All fastings are too high, will increase lantus from 20-->25 u q hs. Having some lows--discussed 3 meals, 3 snacks, protein--will send to diabetes educator ASAP - insulin glargine (LANTUS) 100 UNIT/ML Solostar  Pen; Inject 25 Units into the skin at bedtime.  Dispense: 15 mL; Refill: 11 - Glucagon 1 MG/0.2ML SOAJ; Inject 1 mg into the skin as needed.  Dispense: 0.2 mL; Refill: 1 - Referral to Nutrition and Diabetes Services - Ambulatory referral to Pediatric Cardiology  2. Supervision of high risk pregnancy, antepartum   3. Previous cesarean section complicating pregnancy, antepartum condition or complication To discuss more later    General obstetric precautions including but not limited to vaginal bleeding, contractions, leaking of fluid and fetal movement were reviewed in detail with the patient. Please refer to After Visit Summary for other counseling recommendations.   Return in 2 weeks (on 03/17/2021) for diabetes educator- please schedule asap.  Future Appointments  Date Time Provider Department Center  03/15/2021 11:45 AM Reva Bores, MD [facility]  03/21/2021 10:30 AM [facility]  03/21/2021 10:45 AM [facility] US5 [facility]    Reva Bores, MD

## 2021-03-03 NOTE — Patient Instructions (Addendum)
Following an appropriate diet and keeping your blood sugar under control is the most important thing to do for your health and that of your unborn baby.  Please check your blood sugar 4 times daily.  Please keep accurate BS logs and bring them with you to every visit.  Please bring your meter also.  Goals for Blood sugar should be: 1. Fasting (first thing in the morning before eating) should be less than 90.   2.  2 hours after meals should be less than 120.  Please eat 3 meals and 3 snacks.  Include protein (meat, dairy-cheese, eggs, nuts) with all meals.  Be mindful that carbohydrates increase your blood sugar.  Not just sweet food (cookies, cake, donuts, fruit, juice, soda) but also bread, pasta, rice, and potatoes.  You have to limit how many carbs you are eating.  Adding exercise, as little as 30 minutes a day can decrease your blood sugar.   Breastfeeding  Choosing to breastfeed is one of the best decisions you can make for yourself and your baby. A change in hormones during pregnancy causes your breasts to make breast milk in your milk-producing glands. Hormones prevent breast milk from being released before your baby is born. They also prompt milk flow after birth. Once breastfeeding has begun, thoughts of your baby, as well as his or her sucking or crying, can stimulate the release of milk from your milk-producing glands. Benefits of breastfeeding Research shows that breastfeeding offers many health benefits for infants and mothers. It also offers a cost-free and convenient way to feed your baby. For your baby  Your first milk (colostrum) helps your baby's digestive system to function better.  Special cells in your milk (antibodies) help your baby to fight off infections.  Breastfed babies are less likely to develop asthma, allergies, obesity, or type 2 diabetes. They are also at lower risk for sudden infant death syndrome (SIDS).  Nutrients in breast milk are better able to  meet your baby's needs compared to infant formula.  Breast milk improves your baby's brain development. For you  Breastfeeding helps to create a very special bond between you and your baby.  Breastfeeding is convenient. Breast milk costs nothing and is always available at the correct temperature.  Breastfeeding helps to burn calories. It helps you to lose the weight that you gained during pregnancy.  Breastfeeding makes your uterus return faster to its size before pregnancy. It also slows bleeding (lochia) after you give birth.  Breastfeeding helps to lower your risk of developing type 2 diabetes, osteoporosis, rheumatoid arthritis, cardiovascular disease, and breast, ovarian, uterine, and endometrial cancer later in life. Breastfeeding basics Starting breastfeeding  Find a comfortable place to sit or lie down, with your neck and back well-supported.  Place a pillow or a rolled-up blanket under your baby to bring him or her to the level of your breast (if you are seated). Nursing pillows are specially designed to help support your arms and your baby while you breastfeed.  Make sure that your baby's tummy (abdomen) is facing your abdomen.  Gently massage your breast. With your fingertips, massage from the outer edges of your breast inward toward the nipple. This encourages milk flow. If your milk flows slowly, you may need to continue this action during the feeding.  Support your breast with 4 fingers underneath and your thumb above your nipple (make the letter "C" with your hand). Make sure your fingers are well away from your nipple and your baby's   mouth.  Stroke your baby's lips gently with your finger or nipple.  When your baby's mouth is open wide enough, quickly bring your baby to your breast, placing your entire nipple and as much of the areola as possible into your baby's mouth. The areola is the colored area around your nipple. ? More areola should be visible above your baby's  upper lip than below the lower lip. ? Your baby's lips should be opened and extended outward (flanged) to ensure an adequate, comfortable latch. ? Your baby's tongue should be between his or her lower gum and your breast.  Make sure that your baby's mouth is correctly positioned around your nipple (latched). Your baby's lips should create a seal on your breast and be turned out (everted).  It is common for your baby to suck about 2-3 minutes in order to start the flow of breast milk. Latching Teaching your baby how to latch onto your breast properly is very important. An improper latch can cause nipple pain, decreased milk supply, and poor weight gain in your baby. Also, if your baby is not latched onto your nipple properly, he or she may swallow some air during feeding. This can make your baby fussy. Burping your baby when you switch breasts during the feeding can help to get rid of the air. However, teaching your baby to latch on properly is still the best way to prevent fussiness from swallowing air while breastfeeding. Signs that your baby has successfully latched onto your nipple  Silent tugging or silent sucking, without causing you pain. Infant's lips should be extended outward (flanged).  Swallowing heard between every 3-4 sucks once your milk has started to flow (after your let-down milk reflex occurs).  Muscle movement above and in front of his or her ears while sucking. Signs that your baby has not successfully latched onto your nipple  Sucking sounds or smacking sounds from your baby while breastfeeding.  Nipple pain. If you think your baby has not latched on correctly, slip your finger into the corner of your baby's mouth to break the suction and place it between your baby's gums. Attempt to start breastfeeding again. Signs of successful breastfeeding Signs from your baby  Your baby will gradually decrease the number of sucks or will completely stop sucking.  Your baby will  fall asleep.  Your baby's body will relax.  Your baby will retain a small amount of milk in his or her mouth.  Your baby will let go of your breast by himself or herself. Signs from you  Breasts that have increased in firmness, weight, and size 1-3 hours after feeding.  Breasts that are softer immediately after breastfeeding.  Increased milk volume, as well as a change in milk consistency and color by the fifth day of breastfeeding.  Nipples that are not sore, cracked, or bleeding. Signs that your baby is getting enough milk  Wetting at least 1-2 diapers during the first 24 hours after birth.  Wetting at least 5-6 diapers every 24 hours for the first week after birth. The urine should be clear or pale yellow by the age of 5 days.  Wetting 6-8 diapers every 24 hours as your baby continues to grow and develop.  At least 3 stools in a 24-hour period by the age of 5 days. The stool should be soft and yellow.  At least 3 stools in a 24-hour period by the age of 7 days. The stool should be seedy and yellow.  No loss  of weight greater than 10% of birth weight during the first 3 days of life.  Average weight gain of 4-7 oz (113-198 g) per week after the age of 4 days.  Consistent daily weight gain by the age of 5 days, without weight loss after the age of 2 weeks. After a feeding, your baby may spit up a small amount of milk. This is normal. Breastfeeding frequency and duration Frequent feeding will help you make more milk and can prevent sore nipples and extremely full breasts (breast engorgement). Breastfeed when you feel the need to reduce the fullness of your breasts or when your baby shows signs of hunger. This is called "breastfeeding on demand." Signs that your baby is hungry include:  Increased alertness, activity, or restlessness.  Movement of the head from side to side.  Opening of the mouth when the corner of the mouth or cheek is stroked (rooting).  Increased sucking  sounds, smacking lips, cooing, sighing, or squeaking.  Hand-to-mouth movements and sucking on fingers or hands.  Fussing or crying. Avoid introducing a pacifier to your baby in the first 4-6 weeks after your baby is born. After this time, you may choose to use a pacifier. Research has shown that pacifier use during the first year of a baby's life decreases the risk of sudden infant death syndrome (SIDS). Allow your baby to feed on each breast as long as he or she wants. When your baby unlatches or falls asleep while feeding from the first breast, offer the second breast. Because newborns are often sleepy in the first few weeks of life, you may need to awaken your baby to get him or her to feed. Breastfeeding times will vary from baby to baby. However, the following rules can serve as a guide to help you make sure that your baby is properly fed:  Newborns (babies 80 weeks of age or younger) may breastfeed every 1-3 hours.  Newborns should not go without breastfeeding for longer than 3 hours during the day or 5 hours during the night.  You should breastfeed your baby a minimum of 8 times in a 24-hour period. Breast milk pumping Pumping and storing breast milk allows you to make sure that your baby is exclusively fed your breast milk, even at times when you are unable to breastfeed. This is especially important if you go back to work while you are still breastfeeding, or if you are not able to be present during feedings. Your lactation consultant can help you find a method of pumping that works best for you and give you guidelines about how long it is safe to store breast milk.      Caring for your breasts while you breastfeed Nipples can become dry, cracked, and sore while breastfeeding. The following recommendations can help keep your breasts moisturized and healthy:  Avoid using soap on your nipples.  Wear a supportive bra designed especially for nursing. Avoid wearing underwire-style bras or  extremely tight bras (sports bras).  Air-dry your nipples for 3-4 minutes after each feeding.  Use only cotton bra pads to absorb leaked breast milk. Leaking of breast milk between feedings is normal.  Use lanolin on your nipples after breastfeeding. Lanolin helps to maintain your skin's normal moisture barrier. Pure lanolin is not harmful (not toxic) to your baby. You may also hand express a few drops of breast milk and gently massage that milk into your nipples and allow the milk to air-dry. In the first few weeks after giving  birth, some women experience breast engorgement. Engorgement can make your breasts feel heavy, warm, and tender to the touch. Engorgement peaks within 3-5 days after you give birth. The following recommendations can help to ease engorgement:  Completely empty your breasts while breastfeeding or pumping. You may want to start by applying warm, moist heat (in the shower or with warm, water-soaked hand towels) just before feeding or pumping. This increases circulation and helps the milk flow. If your baby does not completely empty your breasts while breastfeeding, pump any extra milk after he or she is finished.  Apply ice packs to your breasts immediately after breastfeeding or pumping, unless this is too uncomfortable for you. To do this: ? Put ice in a plastic bag. ? Place a towel between your skin and the bag. ? Leave the ice on for 20 minutes, 2-3 times a day.  Make sure that your baby is latched on and positioned properly while breastfeeding. If engorgement persists after 48 hours of following these recommendations, contact your health care provider or a Advertising copywriter. Overall health care recommendations while breastfeeding  Eat 3 healthy meals and 3 snacks every day. Well-nourished mothers who are breastfeeding need an additional 450-500 calories a day. You can meet this requirement by increasing the amount of a balanced diet that you eat.  Drink enough water  to keep your urine pale yellow or clear.  Rest often, relax, and continue to take your prenatal vitamins to prevent fatigue, stress, and low vitamin and mineral levels in your body (nutrient deficiencies).  Do not use any products that contain nicotine or tobacco, such as cigarettes and e-cigarettes. Your baby may be harmed by chemicals from cigarettes that pass into breast milk and exposure to secondhand smoke. If you need help quitting, ask your health care provider.  Avoid alcohol.  Do not use illegal drugs or marijuana.  Talk with your health care provider before taking any medicines. These include over-the-counter and prescription medicines as well as vitamins and herbal supplements. Some medicines that may be harmful to your baby can pass through breast milk.  It is possible to become pregnant while breastfeeding. If birth control is desired, ask your health care provider about options that will be safe while breastfeeding your baby. Where to find more information: [employer]: www.llli.org Contact a health care provider if:  You feel like you want to stop breastfeeding or have become frustrated with breastfeeding.  Your nipples are cracked or bleeding.  Your breasts are red, tender, or warm.  You have: ? Painful breasts or nipples. ? A swollen area on either breast. ? A fever or chills. ? Nausea or vomiting. ? Drainage other than breast milk from your nipples.  Your breasts do not become full before feedings by the fifth day after you give birth.  You feel sad and depressed.  Your baby is: ? Too sleepy to eat well. ? Having trouble sleeping. ? More than 53 week old and wetting fewer than 6 diapers in a 24-hour period. ? Not gaining weight by 88 days of age.  Your baby has fewer than 3 stools in a 24-hour period.  Your baby's skin or the white parts of his or her eyes become yellow. Get help right away if:  Your baby is overly tired (lethargic) and  does not want to wake up and feed.  Your baby develops an unexplained fever. Summary  Breastfeeding offers many health benefits for infant and mothers.  Try to breastfeed  your infant when he or she shows early signs of hunger.  Gently tickle or stroke your baby's lips with your finger or nipple to allow the baby to open his or her mouth. Bring the baby to your breast. Make sure that much of the areola is in your baby's mouth. Offer one side and burp the baby before you offer the other side.  Talk with your health care provider or lactation consultant if you have questions or you face problems as you breastfeed. This information is not intended to replace advice given to you by your health care provider. Make sure you discuss any questions you have with your health care provider. Document Revised: 02/07/2018 Document Reviewed: 12/15/2016 Elsevier Patient Education  2021 [employer].

## 2021-03-14 ENCOUNTER — Encounter: Payer: Self-pay | Admitting: Dietician

## 2021-03-14 ENCOUNTER — Encounter: Payer: Medicaid Other | Attending: Family Medicine | Admitting: Dietician

## 2021-03-14 ENCOUNTER — Other Ambulatory Visit: Payer: Self-pay

## 2021-03-14 DIAGNOSIS — O24312 Unspecified pre-existing diabetes mellitus in pregnancy, second trimester: Secondary | ICD-10-CM | POA: Diagnosis not present

## 2021-03-14 NOTE — Progress Notes (Signed)
Diabetes Self-Management Education  Visit Type: First/Initial  Appt. Start Time: 0807 Appt. End Time:  0907  03/14/2021  Haley [name], identified by name and date of birth, is a 30 y.o. female with a diagnosis of Diabetes: Type 2.   ASSESSMENT Patient is here today alone.  History includes  Type 2 Diabetes (2017).  [redacted] weeks gestation. A1C 8.8% 02/17/2021 Medications include:  Metformin, Lantus 25 units q HS, Novolog 10 units before each meal  Patient lives with her fiance and daughter.  Patient is a stay at home mom.  She does the shopping and cooking.   Height 5\' 2"  (1.575 m), weight 186 lb (84.4 kg), last menstrual period 11/05/2020, currently breastfeeding. Body mass index is 34.02 kg/m.   Diabetes Self-Management Education - 03/14/21 0821      Visit Information   Visit Type First/Initial      Initial Visit   Diabetes Type Type 2    Are you currently following a meal plan? Yes    What type of meal plan do you follow? low carb    Are you taking your medications as prescribed? Yes    Date Diagnosed 2017      Health Coping   How would you rate your overall health? Good      Psychosocial Assessment   Patient Belief/Attitude about Diabetes Motivated to manage diabetes    Self-care barriers None    Self-management support Doctor's office;Family    Other persons present Patient    Patient Concerns Nutrition/Meal planning;Glycemic Control    Special Needs None    Preferred Learning Style No preference indicated    Learning Readiness Ready    What is the last grade level you completed in school? some college      Pre-Education Assessment   Patient understands the diabetes disease and treatment process. Needs Review    Patient understands incorporating nutritional management into lifestyle. Needs Review    Patient undertands incorporating physical activity into lifestyle. Needs Review    Patient understands using medications safely. Needs Review    Patient  understands monitoring blood glucose, interpreting and using results Needs Review    Patient understands prevention, detection, and treatment of acute complications. Needs Review    Patient understands prevention, detection, and treatment of chronic complications. Needs Review    Patient understands how to develop strategies to address psychosocial issues. Needs Review    Patient understands how to develop strategies to promote health/change behavior. Needs Review      Complications   Last HgB A1C per patient/outside source 8.8 %   02/17/2021   How often do you check your blood sugar? > 4 times/day    Fasting Blood glucose range (mg/dL) 02/19/2021    Postprandial Blood glucose range (mg/dL) 60-737    Number of hypoglycemic episodes per month 1    Can you tell when your blood sugar is low? Yes    What do you do if your blood sugar is low? ate peanut butter    Have you had a dilated eye exam in the past 12 months? Yes    Have you had a dental exam in the past 12 months? Yes    Are you checking your feet? Yes    How many days per week are you checking your feet? 2      Dietary Intake   Breakfast 2 strips bacon, 2 eggs, 1-2 slices seed bread    Snack (morning) fruit or skips    Lunch sandwich  or tortilla with meat OR salad    Snack (afternoon) fruit or pretzels or chicken or skips    Dinner chicken, vegetable, starch (mashed potatoes or rice)    Snack (evening) peanut butter or pretzels    Beverage(s) water, occasional diet coke      Exercise   Exercise Type Light (walking / raking leaves)    How many days per week to you exercise? 3    How many minutes per day do you exercise? 60    Total minutes per week of exercise 180      Patient Education   Previous Diabetes Education Yes (please comment)   2018   Disease state  Definition of diabetes, type 1 and 2, and the diagnosis of diabetes    Nutrition management  Role of diet in the treatment of diabetes and the relationship between the  three main macronutrients and blood glucose level;Meal options for control of blood glucose level and chronic complications.;Meal timing in regards to the patients' current diabetes medication.    Physical activity and exercise  Role of exercise on diabetes management, blood pressure control and cardiac health.    Medications Reviewed patients medication for diabetes, action, purpose, timing of dose and side effects.;Other (comment)   taught patient about glucagon   Monitoring Taught/discussed recording of test results and interpretation of SMBG.;Identified appropriate SMBG and/or A1C goals.;Daily foot exams    Acute complications Taught treatment of hypoglycemia - the 15 rule.;Discussed and identified patients' treatment of hyperglycemia.    Chronic complications Relationship between chronic complications and blood glucose control    Psychosocial adjustment Identified and addressed patients feelings and concerns about diabetes;Role of stress on diabetes    Preconception care Pregnancy and GDM  Role of pre-pregnancy blood glucose control on the development of the fetus;Reviewed with patient blood glucose goals with pregnancy;Role of family planning for patients with diabetes      Individualized Goals (developed by patient)   Nutrition Follow meal plan discussed    Physical Activity Exercise 3-5 times per week;60 minutes per day    Medications take my medication as prescribed    Monitoring  test my blood glucose as discussed    Reducing Risk increase portions of healthy fats;examine blood glucose patterns;do foot checks daily    Health Coping discuss diabetes with (comment)   MD, RD, CDCES     Post-Education Assessment   Patient understands the diabetes disease and treatment process. Demonstrates understanding / competency    Patient understands incorporating nutritional management into lifestyle. Demonstrates understanding / competency    Patient undertands incorporating physical activity into  lifestyle. Demonstrates understanding / competency    Patient understands using medications safely. Demonstrates understanding / competency    Patient understands monitoring blood glucose, interpreting and using results Demonstrates understanding / competency    Patient understands prevention, detection, and treatment of acute complications. Demonstrates understanding / competency    Patient understands prevention, detection, and treatment of chronic complications. Demonstrates understanding / competency    Patient understands how to develop strategies to address psychosocial issues. Demonstrates understanding / competency    Patient understands how to develop strategies to promote health/change behavior. Demonstrates understanding / competency      Outcomes   Expected Outcomes Demonstrated interest in learning. Expect positive outcomes    Future DMSE PRN    Program Status Completed           Individualized Plan for Diabetes Self-Management Training:   Learning Objective:  Patient will have  a greater understanding of diabetes self-management. Patient education plan is to attend individual and/or group sessions per assessed needs and concerns.   Plan:   Patient Instructions  Continue to take your medication as prescribed. Continue to check your blood glucose 4 times daily (before breakfast and 2 hours after the start of breakfast, lunch, dinner)  3 meals and 2-3 snacks daily. Spread the carbohydrates out throughout the day and be mindful of nutritional choices. Half the plate should be non starchy vegetables.  Goals: 2-3 carb choices per meal (about 45 grams per meal) 1 carb choice per snack (15 grams per snack) Continue to stay active   Expected Outcomes:  Demonstrated interest in learning. Expect positive outcomes  Education material provided: ADA - How to Thrive: A Guide for Your Journey with Diabetes, Meal plan card, Snack sheet and Diabetes Resources; Diabetes during  pregnancy  If problems or questions, patient to contact team via:  Phone  Future DSME appointment: PRN

## 2021-03-14 NOTE — Patient Instructions (Signed)
Continue to take your medication as prescribed. Continue to check your blood glucose 4 times daily (before breakfast and 2 hours after the start of breakfast, lunch, dinner)  3 meals and 2-3 snacks daily. Spread the carbohydrates out throughout the day and be mindful of nutritional choices. Half the plate should be non starchy vegetables.  Goals: 2-3 carb choices per meal (about 45 grams per meal) 1 carb choice per snack (15 grams per snack) Continue to stay active

## 2021-03-15 ENCOUNTER — Encounter: Payer: Self-pay | Admitting: Family Medicine

## 2021-03-15 ENCOUNTER — Telehealth (INDEPENDENT_AMBULATORY_CARE_PROVIDER_SITE_OTHER): Payer: Medicaid Other | Admitting: Family Medicine

## 2021-03-15 ENCOUNTER — Other Ambulatory Visit: Payer: Self-pay

## 2021-03-15 VITALS — BP 108/95

## 2021-03-15 DIAGNOSIS — O0990 Supervision of high risk pregnancy, unspecified, unspecified trimester: Secondary | ICD-10-CM

## 2021-03-15 DIAGNOSIS — E669 Obesity, unspecified: Secondary | ICD-10-CM

## 2021-03-15 DIAGNOSIS — E119 Type 2 diabetes mellitus without complications: Secondary | ICD-10-CM

## 2021-03-15 DIAGNOSIS — O34219 Maternal care for unspecified type scar from previous cesarean delivery: Secondary | ICD-10-CM

## 2021-03-15 DIAGNOSIS — O24312 Unspecified pre-existing diabetes mellitus in pregnancy, second trimester: Secondary | ICD-10-CM

## 2021-03-15 DIAGNOSIS — Z3A18 18 weeks gestation of pregnancy: Secondary | ICD-10-CM

## 2021-03-15 DIAGNOSIS — Z148 Genetic carrier of other disease: Secondary | ICD-10-CM

## 2021-03-15 DIAGNOSIS — O24112 Pre-existing diabetes mellitus, type 2, in pregnancy, second trimester: Secondary | ICD-10-CM

## 2021-03-15 DIAGNOSIS — O99820 Streptococcus B carrier state complicating pregnancy: Secondary | ICD-10-CM

## 2021-03-15 DIAGNOSIS — O99212 Obesity complicating pregnancy, second trimester: Secondary | ICD-10-CM

## 2021-03-15 MED ORDER — INSULIN GLARGINE 100 UNIT/ML SOLOSTAR PEN: 18.0000 [IU] | PEN_INJECTOR | Freq: Every day | SUBCUTANEOUS | 11 refills | Status: DC

## 2021-03-15 NOTE — Progress Notes (Signed)
   OBSTETRICS PRENATAL VIRTUAL VISIT ENCOUNTER NOTE  Provider location: [facility]   Patient location: Home  I connected with Haley [name] on 03/15/21 at 11:45 AM EDT by MyChart Video Encounter and verified that I am speaking with the correct person using two identifiers. I discussed the limitations, risks, security and privacy concerns of performing an evaluation and management service virtually and the availability of in person appointments. I also discussed with the patient that there may be a patient responsible charge related to this service. The patient expressed understanding and agreed to proceed. Subjective:  Haley [name] is a 30 y.o. G2P1001 at [redacted]w[redacted]d being seen today for ongoing prenatal care.  She is currently monitored for the following issues for this high-risk pregnancy and has Obesity (BMI 30-39.9); DM (diabetes mellitus), type 2 ([facility]); Alpha thalassemia trait; GBS bacteriuria; Pilonidal cyst with abscess; Previous cesarean section complicating pregnancy, antepartum condition or complication; Supervision of high risk pregnancy, antepartum; and Pre-existing diabetes mellitus in pregnancy on their problem list.  Patient reports no complaints.  Contractions: Not present. Vag. Bleeding: None.   . Denies any leaking of fluid.   The following portions of the patient's history were reviewed and updated as appropriate: allergies, current medications, past family history, past medical history, past social history, past surgical history and problem list.   Objective:   Vitals:   03/15/21 1131  BP: (!) 108/95    Fetal Status:           General:  Alert, oriented and cooperative. Patient is in no acute distress.  Respiratory: Normal respiratory effort, no problems with respiration noted  Mental Status: Normal mood and affect. Normal behavior. Normal judgment and thought content.  Rest of physical exam deferred due to type of encounter  Imaging: No  results found.  Assessment and Plan:  Pregnancy: G2P1001 at [redacted]w[redacted]d 1. Pre-existing diabetes mellitus during pregnancy in second trimester Dropping at night with increase in lantus to 25 u--will drop to 18 u q hs and see if this improves her fastings. Met with CDE yesterday to review diet. - insulin glargine (LANTUS) 100 UNIT/ML Solostar Pen; Inject 18 Units into the skin at bedtime.  Dispense: 15 mL; Refill: 11   2. Previous cesarean section complicating pregnancy, antepartum condition or complication To review later  3. Supervision of high risk pregnancy, antepartum Continue prenatal care.  General obstetric precautions including but not limited to vaginal bleeding, contractions, leaking of fluid and fetal movement were reviewed in detail with the patient. I discussed the assessment and treatment plan with the patient. The patient was provided an opportunity to ask questions and all were answered. The patient agreed with the plan and demonstrated an understanding of the instructions. The patient was advised to call back or seek an in-person office evaluation/go to [facility] at [facility] for any urgent or concerning symptoms. Please refer to After Visit Summary for other counseling recommendations.   I provided 8 minutes of face-to-face time during this encounter.  Return in 2 weeks (on 03/29/2021).  Future Appointments  Date Time Provider [place]  03/21/2021 10:30 AM [facility] NURSE [facility]  03/21/2021 10:45 AM [facility] US5 [facility]  04/12/2021  9:00 AM Anyanwu, Sallyanne Havers, MD [facility]    Donnamae Jude, MD [facility] [place], [place] .

## 2021-03-15 NOTE — Patient Instructions (Signed)

## 2021-03-21 ENCOUNTER — Ambulatory Visit: Payer: Medicaid Other | Attending: Family Medicine

## 2021-03-21 ENCOUNTER — Other Ambulatory Visit: Payer: Self-pay

## 2021-03-21 ENCOUNTER — Encounter: Payer: Self-pay | Admitting: *Deleted

## 2021-03-21 ENCOUNTER — Ambulatory Visit: Payer: Medicaid Other | Admitting: *Deleted

## 2021-03-21 ENCOUNTER — Other Ambulatory Visit: Payer: Self-pay | Admitting: *Deleted

## 2021-03-21 DIAGNOSIS — Z362 Encounter for other antenatal screening follow-up: Secondary | ICD-10-CM

## 2021-03-21 DIAGNOSIS — O34219 Maternal care for unspecified type scar from previous cesarean delivery: Secondary | ICD-10-CM | POA: Diagnosis present

## 2021-03-21 DIAGNOSIS — E119 Type 2 diabetes mellitus without complications: Secondary | ICD-10-CM

## 2021-03-21 DIAGNOSIS — Z3480 Encounter for supervision of other normal pregnancy, unspecified trimester: Secondary | ICD-10-CM

## 2021-04-12 ENCOUNTER — Other Ambulatory Visit: Payer: Self-pay

## 2021-04-12 ENCOUNTER — Ambulatory Visit (INDEPENDENT_AMBULATORY_CARE_PROVIDER_SITE_OTHER): Payer: Medicaid Other | Admitting: Obstetrics & Gynecology

## 2021-04-12 VITALS — BP 118/72 | HR 99 | Wt 189.0 lb

## 2021-04-12 DIAGNOSIS — O0990 Supervision of high risk pregnancy, unspecified, unspecified trimester: Secondary | ICD-10-CM

## 2021-04-12 DIAGNOSIS — Z3A21 21 weeks gestation of pregnancy: Secondary | ICD-10-CM

## 2021-04-12 DIAGNOSIS — O24312 Unspecified pre-existing diabetes mellitus in pregnancy, second trimester: Secondary | ICD-10-CM

## 2021-04-12 MED ORDER — NOVOLOG FLEXPEN 100 UNIT/ML ~~LOC~~ SOPN: PEN_INJECTOR | SUBCUTANEOUS | 11 refills | Status: DC

## 2021-04-12 MED ORDER — INSULIN GLARGINE 100 UNIT/ML SOLOSTAR PEN
22.0000 [IU] | PEN_INJECTOR | Freq: Every day | SUBCUTANEOUS | 11 refills | Status: DC
Start: 2021-04-12 — End: 2021-05-10

## 2021-04-12 NOTE — Progress Notes (Signed)
PRENATAL VISIT NOTE  Subjective:  Haley [name] is a 30 y.o. G2P1001 at [redacted]w[redacted]d being seen today for ongoing prenatal care.  She is currently monitored for the following issues for this high-risk pregnancy and has Obesity (BMI 30-39.9); DM (diabetes mellitus), type 2 ([facility]); Alpha thalassemia trait; GBS bacteriuria; Pilonidal cyst with abscess; Previous cesarean section complicating pregnancy, antepartum condition or complication; Supervision of high risk pregnancy, antepartum; and Pre-existing diabetes mellitus in pregnancy on their problem list.  Patient reports no complaints.  Contractions: Not present. Vag. Bleeding: None.  Movement: Present. Denies leaking of fluid.   The following portions of the patient's history were reviewed and updated as appropriate: allergies, current medications, past family history, past medical history, past social history, past surgical history and problem list.   Objective:   Vitals:   04/12/21 0917  BP: 118/72  Pulse: 99  Weight: 189 lb (85.7 kg)    Fetal Status: Fetal Heart Rate (bpm): 156   Movement: Present     General:  Alert, oriented and cooperative. Patient is in no acute distress.  Skin: Skin is warm and dry. No rash noted.   Cardiovascular: Normal heart rate noted  Respiratory: Normal respiratory effort, no problems with respiration noted  Abdomen: Soft, gravid, appropriate for gestational age.  Pain/Pressure: Absent     Pelvic: Cervical exam deferred        Extremities: Normal range of motion.     Mental Status: Normal mood and affect. Normal behavior. Normal judgment and thought content.   Imaging: [place] MFM OB DETAIL +14 WK  Result Date: 03/21/2021 ----------------------------------------------------------------------  OBSTETRICS REPORT                       (Signed Final 03/21/2021 05:21 pm) ---------------------------------------------------------------------- Patient Info  ID #:       [id number]                          D.O.B.:  1991-09-09  (29 yrs)  Name:       Haley [name]                    Visit Date: 03/21/2021 10:29 am ---------------------------------------------------------------------- Performed By  Attending:        Noralee Space MD        Ref. Address:     [address]                                                             [place], [place]                                                             [address]  Performed By:  Reinaldo Raddle            [facility]  Referred By:      Reva Bores                    MD ---------------------------------------------------------------------- Orders  #  Description                           Code        Ordered By  1  [place] MFM OB DETAIL +14 WK               76811.01    Tinnie Gens ----------------------------------------------------------------------  #  Order #                     Accession #                Episode #  1  [id number]                   [id number]                 [id number] ---------------------------------------------------------------------- Indications  [redacted] weeks gestation of pregnancy                Z3A.18  Encounter for antenatal screening for          Z36.3  malformations (Declined testing)  Obesity complicating pregnancy, second         O99.212  trimester (BMI 34)  Pre-existing diabetes, type 2, in pregnancy,   O24.112  second trimester (Insulin and Metformin)  History of cesarean delivery, currently        O34.219  pregnant ---------------------------------------------------------------------- Fetal Evaluation  Num Of Fetuses:         1  Fetal Heart Rate(bpm):  145  Cardiac Activity:       Observed  Presentation:           Breech  Placenta:               Anterior  P. Cord Insertion:       Visualized, central  Amniotic Fluid  AFI FV:      Subjectively low-normal  Largest Pocket(cm)                              2.71 ---------------------------------------------------------------------- Biometry  BPD:      45.4  mm     G. Age:  19w 5d         88  %    CI:        83.74   %    70 - 86                                                          FL/HC:      16.2   %    16.1 - 18.3  HC:      156.4  mm     G. Age:  18w 4d         34  %    HC/AC:      1.15        1.09 - 1.39  AC:       136   mm     G. Age:  19w 0d         58  %    FL/BPD:     55.9   %  FL:       25.4  mm     G. Age:  17w 5d         12  %    FL/AC:      18.7   %    20 - 24  HUM:      29.9  mm     G. Age:  19w 6d         81  %  CER:      20.2  mm     G. Age:  19w 3d         76  %  NFT:       5.2  mm  LV:        9.5  mm  CM:        4.4  mm  Est. FW:     241  gm      0 lb 9 oz     31  % ---------------------------------------------------------------------- OB History  Gravidity:    2  Living:       1 ---------------------------------------------------------------------- Gestational Age  LMP:           19w 3d        Date:  11/05/20                 EDD:   08/12/21  U/S Today:     18w 5d                                        EDD:   08/17/21  Best:          18w 5d     Det. By:  U/S (03/21/21)           EDD:   08/17/21 ---------------------------------------------------------------------- Anatomy  Cranium:               Appears normal  Aortic Arch:            Appears normal  Cavum:                 Appears normal         Ductal Arch:            Not well visualized  Ventricles:            Appears normal         Diaphragm:              Appears normal  Choroid Plexus:        Appears normal         Stomach:                Appears normal, left                                                                        sided  Cerebellum:            Appears normal         Abdomen:                Appears normal  Posterior Fossa:        Appears normal         Abdominal Wall:         Appears nml (cord                                                                        insert, abd wall)  Nuchal Fold:           Appears normal         Cord Vessels:           Appears normal (3                                                                        vessel cord)  Face:                  Appears normal         Kidneys:                Appear normal                         (orbits and profile)  Lips:                  Appears normal         Bladder:                Appears normal  Thoracic:  Appears normal         Spine:                  Appears normal  Heart:                 Not well visualized    Upper Extremities:      Appears normal  RVOT:                  Not well visualized    Lower Extremities:      Appears normal  LVOT:                  Appears normal ---------------------------------------------------------------------- Doppler - Fetal Vessels  Umbilical Artery   S/D                RI               PI              PSV    ADFV    RDFV                                                     (cm/s)   4.25             0.76             1.29             19.11      No      No ---------------------------------------------------------------------- Cervix Uterus Adnexa  Cervix  Length:           2.41  cm.  Normal appearance by transabdominal scan.  Uterus  No abnormality visualized.  Right Ovary  Within normal limits.  Left Ovary  Within normal limits.  Cul De Sac  No free fluid seen.  Adnexa  No adnexal mass visualized. ---------------------------------------------------------------------- Impression  G2 P1.  Patient is here for fetal anatomy scan.  She has type  2 diabetes and takes insulin and metformin for control.  Patient had opted not to screen for fetal aneuploidies.  Obstetric history significant for a term cesarean delivery.  Patient is unsure of her LMP date. Her EDD was established  by 14-week ultrasound taking only BPD measurement  (not  accurate).  We performed a fetal anatomy scan.  Fetal biometry is  consistent with 18 weeks and 5 days gestation.  Amniotic  fluid is normal and good fetal activity seen.  No markers of  aneuploidies or fetal structural defects are seen.  Placenta is anterior and has few lacunae.  No evidence of  previa.  I could not clearly assess the lower portion of the  placenta and the bladder wall.  We have assigned her EDD at 08/17/2021 based on today's  ultrasound measurements. ---------------------------------------------------------------------- Recommendations  -Patient has an appointment for fetal echocardiography.  -An appointment was made for her to return in 4 weeks for  completion of fetal anatomy scan.  -Transvaginal ultrasound with partially full bladder to be  performed at her next visit if placenta could not be assessed  clearly on transabdominal scan. ----------------------------------------------------------------------                  Noralee Spaceavi Shankar, MD Electronically Signed Final Report   03/21/2021 05:21 pm ----------------------------------------------------------------------  Assessment and Plan:  Pregnancy: G2P1001 at [redacted]w[redacted]d 1. Pre-existing diabetes mellitus during pregnancy in second trimester  All sugars elevated except lunchtime, increased Lantus and Novolog accordingly.  Will reevaluate in one week.  Will check labs surveillance today. Patient will also be scheduled for fetal ECHO soon.  Follow up scans as per MFM and antenatal testing later in pregnancy, - insulin glargine (LANTUS) 100 UNIT/ML Solostar Pen; Inject 22 Units into the skin at bedtime.  Dispense: 15 mL; Refill: 11 - insulin aspart (NOVOLOG FLEXPEN) 100 UNIT/ML FlexPen; Take 13 units with breakfast and dinner and take 10 units with lunch  Dispense: 15 mL; Refill: 11 - Hemoglobin A1c - Comprehensive metabolic panel  2. [redacted] weeks gestation of pregnancy 3. Supervision of high risk pregnancy, antepartum Preterm labor symptoms  and general obstetric precautions including but not limited to vaginal bleeding, contractions, leaking of fluid and fetal movement were reviewed in detail with the patient. Please refer to After Visit Summary for other counseling recommendations.   Return in about 1 week (around 04/19/2021) for Virtual OB Visit for CBG review .  Future Appointments  Date Time Provider Department Center  04/19/2021 10:30 AM [facility] NURSE [facility]  04/19/2021 10:45 AM [facility] US4 [facility]  05/10/2021 10:15 AM Azriel Jakob, Jethro Bastos, MD [facility]    Jaynie Collins, MD

## 2021-04-12 NOTE — Patient Instructions (Signed)

## 2021-04-13 LAB — COMPREHENSIVE METABOLIC PANEL
ALT: 8 IU/L (ref 0–32)
AST: 13 IU/L (ref 0–40)
Albumin/Globulin Ratio: 1.2 (ref 1.2–2.2)
Albumin: 3.4 g/dL — ABNORMAL LOW (ref 3.9–5.0)
Alkaline Phosphatase: 60 IU/L (ref 44–121)
BUN/Creatinine Ratio: 15 (ref 9–23)
BUN: 8 mg/dL (ref 6–20)
Bilirubin Total: <0.2 mg/dL (ref 0.0–1.2)
CO2: 18 mmol/L — ABNORMAL LOW (ref 20–29)
Calcium: 8.9 mg/dL (ref 8.7–10.2)
Chloride: 103 mmol/L (ref 96–106)
Creatinine, Ser: 0.53 mg/dL — ABNORMAL LOW (ref 0.57–1.00)
Globulin, Total: 2.8 g/dL (ref 1.5–4.5)
Glucose: 242 mg/dL — ABNORMAL HIGH (ref 65–99)
Potassium: 4 mmol/L (ref 3.5–5.2)
Sodium: 136 mmol/L (ref 134–144)
Total Protein: 6.2 g/dL (ref 6.0–8.5)
eGFR: 128 mL/min/{1.73_m2} (ref >59)

## 2021-04-13 LAB — HEMOGLOBIN A1C
Est. average glucose Bld gHb Est-mCnc: 169 mg/dL
Hgb A1c MFr Bld: 7.5 % — ABNORMAL HIGH (ref 4.8–5.6)

## 2021-04-19 ENCOUNTER — Ambulatory Visit: Payer: Medicaid Other | Attending: Obstetrics and Gynecology

## 2021-04-19 ENCOUNTER — Other Ambulatory Visit: Payer: Self-pay

## 2021-04-19 ENCOUNTER — Encounter: Payer: Self-pay | Admitting: Obstetrics & Gynecology

## 2021-04-19 ENCOUNTER — Other Ambulatory Visit: Payer: Self-pay | Admitting: Obstetrics and Gynecology

## 2021-04-19 ENCOUNTER — Ambulatory Visit: Payer: Medicaid Other | Admitting: *Deleted

## 2021-04-19 DIAGNOSIS — O99212 Obesity complicating pregnancy, second trimester: Secondary | ICD-10-CM | POA: Diagnosis not present

## 2021-04-19 DIAGNOSIS — Z362 Encounter for other antenatal screening follow-up: Secondary | ICD-10-CM | POA: Diagnosis not present

## 2021-04-19 DIAGNOSIS — O4402 Placenta previa specified as without hemorrhage, second trimester: Secondary | ICD-10-CM

## 2021-04-19 DIAGNOSIS — O24112 Pre-existing diabetes mellitus, type 2, in pregnancy, second trimester: Secondary | ICD-10-CM

## 2021-04-19 DIAGNOSIS — O34219 Maternal care for unspecified type scar from previous cesarean delivery: Secondary | ICD-10-CM

## 2021-04-19 DIAGNOSIS — Z3A22 22 weeks gestation of pregnancy: Secondary | ICD-10-CM

## 2021-04-19 DIAGNOSIS — E669 Obesity, unspecified: Secondary | ICD-10-CM

## 2021-04-19 DIAGNOSIS — O4400 Placenta previa specified as without hemorrhage, unspecified trimester: Secondary | ICD-10-CM

## 2021-05-10 ENCOUNTER — Ambulatory Visit (INDEPENDENT_AMBULATORY_CARE_PROVIDER_SITE_OTHER): Payer: Medicaid Other | Admitting: Obstetrics & Gynecology

## 2021-05-10 ENCOUNTER — Encounter: Payer: Self-pay | Admitting: Obstetrics & Gynecology

## 2021-05-10 ENCOUNTER — Other Ambulatory Visit: Payer: Self-pay

## 2021-05-10 VITALS — BP 123/78 | HR 98 | Wt 191.2 lb

## 2021-05-10 DIAGNOSIS — O34219 Maternal care for unspecified type scar from previous cesarean delivery: Secondary | ICD-10-CM

## 2021-05-10 DIAGNOSIS — Z3A25 25 weeks gestation of pregnancy: Secondary | ICD-10-CM

## 2021-05-10 DIAGNOSIS — O0990 Supervision of high risk pregnancy, unspecified, unspecified trimester: Secondary | ICD-10-CM

## 2021-05-10 DIAGNOSIS — O4402 Placenta previa specified as without hemorrhage, second trimester: Secondary | ICD-10-CM

## 2021-05-10 DIAGNOSIS — O24312 Unspecified pre-existing diabetes mellitus in pregnancy, second trimester: Secondary | ICD-10-CM

## 2021-05-10 MED ORDER — INSULIN NPH (HUMAN) (ISOPHANE) 100 UNIT/ML ~~LOC~~ SUSP
13.0000 [IU] | Freq: Two times a day (BID) | SUBCUTANEOUS | 11 refills | Status: DC
Start: 2021-05-10 — End: 2021-05-23

## 2021-05-10 MED ORDER — NOVOLOG FLEXPEN 100 UNIT/ML ~~LOC~~ SOPN: 17.0000 [IU] | PEN_INJECTOR | Freq: Three times a day (TID) | SUBCUTANEOUS | 11 refills | Status: DC

## 2021-05-10 NOTE — Progress Notes (Addendum)
PRENATAL VISIT NOTE  Subjective:  Haley [name] is a 30 y.o. G2P1001 at [redacted]w[redacted]d being seen today for ongoing prenatal care.  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  Patient reports  still having elevated fasting CBGs to 170s!  Does not think Lantus is working, wants to try another long acting insulin .  Contractions: Not present. Vag. Bleeding: None.  Movement: Present. Denies leaking of fluid.   The following portions of the patient's history were reviewed and updated as appropriate: allergies, current medications, past family history, past medical history, past social history, past surgical history and problem list.   Objective:   Vitals:   05/10/21 1025  BP: 123/78  Pulse: 98  Weight: 191 lb 3.2 oz (86.7 kg)    Fetal Status: Fetal Heart Rate (bpm): 151   Movement: Present     General:  Alert, oriented and cooperative. Patient is in no acute distress.  Skin: Skin is warm and dry. No rash noted.   Cardiovascular: Normal heart rate noted  Respiratory: Normal respiratory effort, no problems with respiration noted  Abdomen: Soft, gravid, appropriate for gestational age.  Pain/Pressure: Absent     Pelvic: Cervical exam deferred        Extremities: Normal range of motion.     Mental Status: Normal mood and affect. Normal behavior. Normal judgment and thought content.   Imaging: [place] MFM OB Transvaginal  Result Date: 04/19/2021 ----------------------------------------------------------------------  OBSTETRICS REPORT                        (Signed Final 04/19/2021 01:34 pm) ---------------------------------------------------------------------- Patient Info  ID #:       [id number]                          D.O.B.:  25-Mar-1991 (29 yrs)  Name:       Haley [name]                    Visit Date: 04/19/2021 09:19 am ---------------------------------------------------------------------- Performed By  Attending:        Noralee Space MD        Ref. Address:     [address]                                                             [place], [place]  [address]  Performed By:     Eden Lathe BS      Location[facility] RV[facility]                                 [facility]  Referred By:      Reva Bores                    MD ---------------------------------------------------------------------- Orders  #  Description                           Code        Ordered By  1  [place] MFM OB FOLLOW UP                   76816.01    RAVI [facility]  2  [place] MFM OB TRANSVAGINAL                62229.7     RAVI [facility] ----------------------------------------------------------------------  #  Order #                     Accession #                Episode #  1  [id number]                   [id number]                 [id number]  2  [id number]                   [id number]                 [id number] ---------------------------------------------------------------------- Indications  Obesity complicating pregnancy, second         O99.212  trimester (BMI 34)  [redacted] weeks gestation of pregnancy                Z3A.22  Antenatal follow-up for nonvisualized fetal    Z36.2  anatomy  Pre-existing diabetes, type 2, in pregnancy,   O24.112  second trimester (Insulin and Metformin)  History of cesarean  delivery, currently        O34.219  pregnant  Placenta previa specified as without           O44.02  hemorrhage, second trimester ---------------------------------------------------------------------- Fetal Evaluation  Num Of Fetuses:         1  Fetal Heart Rate(bpm):  148  Cardiac Activity:  Observed  Presentation:           Cephalic  Placenta:               Anterior previa  P. Cord Insertion:      Not well visualized  Amniotic Fluid  AFI FV:      Within normal limits                              Largest Pocket(cm)                              4.6 ---------------------------------------------------------------------- Biometry  BPD:      59.3  mm     G. Age:  24w 2d         89  %    CI:        77.26   %    70 - 86                                                          FL/HC:      18.8   %    19.2 - 20.8  HC:      213.6  mm     G. Age:  23w 3d         59  %    HC/AC:      1.14        1.05 - 1.21  AC:       188   mm     G. Age:  23w 4d         65  %    FL/BPD:     67.6   %    71 - 87  FL:       40.1  mm     G. Age:  23w 0d         42  %    FL/AC:      21.3   %    20 - 24  HUM:      37.7  mm     G. Age:  23w 2d         51  %  CER:      25.2  mm     G. Age:  23w 0d         81  %  LV:        5.5  mm  CM:          7  mm  Est. FW:     586  gm      1 lb 5 oz     68  % ---------------------------------------------------------------------- OB History  Gravidity:    2  Living:       1 ---------------------------------------------------------------------- Gestational Age  LMP:           23w 4d        Date:  11/05/20                 EDD:   08/12/21  U/S Today:     23w 4d  EDD:   08/12/21  Best:          22w 6d     Det. By:  U/S  (03/21/21)          EDD:   08/17/21 ---------------------------------------------------------------------- Anatomy  Cranium:               Appears normal         LVOT:                   Previously seen  Cavum:                 Appears normal         Aortic  Arch:            Previously seen  Ventricles:            Appears normal         Ductal Arch:            Appears normal  Choroid Plexus:        Previously seen        Diaphragm:              Appears normal  Cerebellum:            Appears normal         Stomach:                Appears normal, left                                                                        sided  Posterior Fossa:       Appears normal         Abdomen:                Appears normal  Nuchal Fold:           Previously seen        Abdominal Wall:         Previously seen  Face:                  Appears normal         Cord Vessels:           Previously seen                         (orbits and profile)  Lips:                  Previously seen        Kidneys:                Appear normal  Palate:                Appears normal         Bladder:                Appears normal  Thoracic:              Appears normal         Spine:                  Appears normal  Heart:  Appears normal         Upper Extremities:      Appears normal                         (4CH, axis, and                         situs)  RVOT:                  Appears normal         Lower Extremities:      Appears normal  Other:  IVC/SVC visualized. Heels and left open hand/5th digit visualized.          Lenses visualized. Nasal bone visualized. Technically difficult due to          fetal position. ---------------------------------------------------------------------- Cervix Uterus Adnexa  Cervix  Length:            4.6  cm.  Normal appearance by transvaginal scan  Right Ovary  Not visualized.  Left Ovary  Not visualized.  Cul De Sac  No free fluid seen.  Adnexa  No abnormality visualized. ---------------------------------------------------------------------- Impression  Patient with type 2 diabetes returned for completion of fetal  anatomy and placental evaluation. She does not give history  of vaginal bleeding.  Diabetes is not well controlled (prenatal visit on 04/12/21)  and  the patient is taking insulin. She has an appointment for fetal  echocardiography.  Fetal biometry is consistent with her previously established  dates. Amniotic fluid is normal and good fetal activity is seen.  Fetal anatomical survey was completed and appears normal.  Placenta is anterior and previa could not be ruled out. Few  small lacunae (no vascularity) are seen and had "moth-eaten"  appearance. No myometrial thinning is seen.  We performed transvaginal ultrasound. Placental edge  reached and covered internal os. Bladder wall is clearly seen  and there was increased vascularity in the myometrial-  bladder wall interface. No clear invasion or bridging vessels  are seen. Increased vascularity was seen on the anterior  portion of the cervix raising the possibility of placenta accreta  spectrum (PAS).  I counseled the patient that the findings are challenging to  make a definitive diagnosis now. Since PAS is a progressive  condition, follow-up scans are essential.  She had questions about the significance of PAS if  confirmed. I informed her [facility] and if PAS confirmed at surgery, hysterectomy is  usually performed. ---------------------------------------------------------------------- Recommendations  -An appointment was made for her to return in 4 weeks for  fetal growth assessment.  -Transvaginal ultrasound with partially-full bladder at her next  visit.  -PCI to be evaluated.  -Patient was advised against having sexual intercourse. ----------------------------------------------------------------------                  Noralee Spaceavi Shankar, MD Electronically Signed Final Report   04/19/2021 01:34 pm ----------------------------------------------------------------------  US MFM OB FOLLOW UP  Result Date: 04/19/2021 ----------------------------------------------------------------------  OBSTETRICS REPORT                       (Signed Final 04/19/2021 01:34 pm)  ---------------------------------------------------------------------- Patient Info  ID #:       [id number]                          D.O.B.:  [birth year] (29 yrs)  Name:       PERLINE AWE                    Visit Date: 04/19/2021 09:19 am ---------------------------------------------------------------------- Performed By  Attending:        Noralee Space MD        Ref. Address:     [address]                                                             [place], [place]                                                             [address]  Performed By:     Eden Lathe BS      Location[facility]                                 [facility]  Referred By:      Reva Bores                    MD ---------------------------------------------------------------------- Orders  #  Description                           Code        Ordered By  1  [place] MFM OB FOLLOW UP                   76816.01    RAVI SHANKAR  2  [place] MFM OB TRANSVAGINAL                60454.0     RAVI SHANKAR ----------------------------------------------------------------------  #  Order #  Accession #                Episode #  1  [id number]                   [id number]                 [id number]  2  [id number]                   [id number]                 [id number] ---------------------------------------------------------------------- Indications  Obesity complicating pregnancy, second         O99.212  trimester (BMI 34)  [redacted] weeks gestation of pregnancy                Z3A.22  Antenatal follow-up for nonvisualized fetal    Z36.2  anatomy  Pre-existing diabetes, type 2, in pregnancy,   O24.112  second trimester (Insulin and Metformin)  History of cesarean delivery, currently        O34.219  pregnant   Placenta previa specified as without           O44.02  hemorrhage, second trimester ---------------------------------------------------------------------- Fetal Evaluation  Num Of Fetuses:         1  Fetal Heart Rate(bpm):  148  Cardiac Activity:       Observed  Presentation:           Cephalic  Placenta:               Anterior previa  P. Cord Insertion:      Not well visualized  Amniotic Fluid  AFI FV:      Within normal limits                              Largest Pocket(cm)                              4.6 ---------------------------------------------------------------------- Biometry  BPD:      59.3  mm     G. Age:  24w 2d         89  %    CI:        77.26   %    70 - 86                                                          FL/HC:      18.8   %    19.2 - 20.8  HC:      213.6  mm     G. Age:  23w 3d         59  %    HC/AC:      1.14        1.05 - 1.21  AC:       188   mm     G. Age:  23w 4d         65  %    FL/BPD:     67.6   %    71 - 87  FL:       40.1  mm     G. Age:  23w 0d         42  %    FL/AC:      21.3   %    20 - 24  HUM:      37.7  mm     G. Age:  23w 2d         51  %  CER:      25.2  mm     G. Age:  23w 0d         81  %  LV:        5.5  mm  CM:          7  mm  Est. FW:     586  gm      1 lb 5 oz     68  % ---------------------------------------------------------------------- OB History  Gravidity:    2  Living:       1 ---------------------------------------------------------------------- Gestational Age  LMP:           23w 4d        Date:  11/05/20                 EDD:   08/12/21  U/S Today:     23w 4d                                        EDD:   08/12/21  Best:          22w 6d     Det. By:  U/S  (03/21/21)          EDD:   08/17/21 ---------------------------------------------------------------------- Anatomy  Cranium:               Appears normal         LVOT:                   Previously seen  Cavum:                 Appears normal         Aortic Arch:            Previously seen  Ventricles:             Appears normal         Ductal Arch:            Appears normal  Choroid Plexus:        Previously seen        Diaphragm:              Appears normal  Cerebellum:            Appears normal         Stomach:                Appears normal, left                                                                        sided  Posterior Fossa:       Appears normal         Abdomen:  Appears normal  Nuchal Fold:           Previously seen        Abdominal Wall:         Previously seen  Face:                  Appears normal         Cord Vessels:           Previously seen                         (orbits and profile)  Lips:                  Previously seen        Kidneys:                Appear normal  Palate:                Appears normal         Bladder:                Appears normal  Thoracic:              Appears normal         Spine:                  Appears normal  Heart:                 Appears normal         Upper Extremities:      Appears normal                         (4CH, axis, and                         situs)  RVOT:                  Appears normal         Lower Extremities:      Appears normal  Other:  IVC/SVC visualized. Heels and left open hand/5th digit visualized.          Lenses visualized. Nasal bone visualized. Technically difficult due to          fetal position. ---------------------------------------------------------------------- Cervix Uterus Adnexa  Cervix  Length:            4.6  cm.  Normal appearance by transvaginal scan  Right Ovary  Not visualized.  Left Ovary  Not visualized.  Cul De Sac  No free fluid seen.  Adnexa  No abnormality visualized. ---------------------------------------------------------------------- Impression  Patient with type 2 diabetes returned for completion of fetal  anatomy and placental evaluation. She does not give history  of vaginal bleeding.  Diabetes is not well controlled (prenatal visit on 04/12/21) and  the patient is taking insulin. She has an  appointment for fetal  echocardiography.  Fetal biometry is consistent with her previously established  dates. Amniotic fluid is normal and good fetal activity is seen.  Fetal anatomical survey was completed and appears normal.  Placenta is anterior and previa could not be ruled out. Few  small lacunae (no vascularity) are seen and had "moth-eaten"  appearance. No myometrial thinning is seen.  We performed transvaginal ultrasound. Placental edge  reached and covered internal os. Bladder wall is clearly seen  and there was increased  vascularity in the myometrial-  bladder wall interface. No clear invasion or bridging vessels  are seen. Increased vascularity was seen on the anterior  portion of the cervix raising the possibility of placenta accreta  spectrum (PAS).  I counseled the patient that the findings are challenging to  make a definitive diagnosis now. Since PAS is a progressive  condition, follow-up scans are essential.  She had questions about the significance of PAS if  confirmed. I informed her that delivery will be at a [facility] and if PAS confirmed at surgery, hysterectomy is  usually performed. ---------------------------------------------------------------------- Recommendations  -An appointment was made for her to return in 4 weeks for  fetal growth assessment.  -Transvaginal ultrasound with partially-full bladder at her next  visit.  -PCI to be evaluated.  -Patient was advised against having sexual intercourse. ----------------------------------------------------------------------                  Noralee Space, MD Electronically Signed Final Report   04/19/2021 01:34 pm ----------------------------------------------------------------------   Assessment and Plan:  Pregnancy: G2P1001 at [redacted]w[redacted]d 1. Pre-existing diabetes mellitus during pregnancy in second trimester Counseled about switching to NPH/Novolog instead, she agreed. Weight today is 86.7 kg, did weight based calculaiton and ended  up with NPH 13/13, Novolog 17 qac. Continue Metformin 1000 mg po bid.  Will monitor response in one week, hypoglycemic precautions advised.  Follow up growth scans and antenatal testing plans as per MFM.  - insulin NPH Human (HUMULIN N) 100 UNIT/ML injection; Inject 0.13 mLs (13 Units total) into the skin 2 (two) times daily at 8 am and 10 pm.  Dispense: 10 mL; Refill: 11 - insulin aspart (NOVOLOG FLEXPEN) 100 UNIT/ML FlexPen; Inject 17 Units into the skin 3 (three) times daily with meals.  Dispense: 17 mL; Refill: 11  2. 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 Previous cesarean section complicating pregnancy, antepartum condition or complication Bleeding precautions reviewed.  Will follow up ultrasound reports and manage accordingly, as per MFM recommendations.  4. [redacted] weeks gestation of pregnancy 5. Supervision of high risk pregnancy, antepartum Preterm labor symptoms and general obstetric precautions including but not limited to vaginal bleeding, contractions, leaking of fluid and fetal movement were reviewed in detail with the patient. Please refer to After Visit Summary for other counseling recommendations.   Return in about 1 week (around 05/17/2021) for Virtual CBG review with MD 3 weeks: CBC, CMET, HIV, RPR (no GTT needed), TDap, OFFICE MD VISIT.  Future Appointments  Date Time Provider Department Center  05/17/2021 10:15 AM [place] Bing, MD [facility]  05/17/2021  2:45 PM [facility] NURSE [facility]  05/17/2021  3:00 PM [facility] US1 [facility]  05/31/2021  9:30 AM Reva Bores, MD [facility]    Jaynie Collins, MD

## 2021-05-10 NOTE — Patient Instructions (Signed)
Return to office for any scheduled appointments. Call the office or go to the MAU at [facility] if:  You begin to have strong, frequent contractions  Your water breaks.  Sometimes it is a big gush of fluid, sometimes it is just a trickle that keeps getting your panties wet or running down your legs  You have vaginal bleeding.  It is normal to have a small amount of spotting if your cervix was checked.   You do not feel your baby moving like normal.  If you do not, get something to eat and drink and lay down and focus on feeling your baby move.   If your baby is still not moving like normal, you should call the office or go to MAU.  Any other obstetric concerns.  TDaP Vaccine Pregnancy Get the Whooping Cough Vaccine While You Are Pregnant (CDC)  It is important for women to get the whooping cough vaccine in the third trimester of each pregnancy. Vaccines are the best way to prevent this disease. There are 2 different whooping cough vaccines. Both vaccines combine protection against whooping cough, tetanus and diphtheria, but they are for different age groups: Tdap: for everyone 11 years or older, including pregnant women  DTaP: for children 2 months through 6 years of age  You need the whooping cough vaccine during each of your pregnancies The recommended time to get the shot is during your 27th through 36th week of pregnancy, preferably during the earlier part of this time period. The Centers for Disease Control and Prevention (CDC) recommends that pregnant women receive the whooping cough vaccine for adolescents and adults (called Tdap vaccine) during the third trimester of each pregnancy. The recommended time to get the shot is during your 27th through 36th week of pregnancy, preferably during the earlier part of this time period. This replaces the original recommendation that pregnant women get the vaccine only if they had not previously received it. The  American College of Obstetricians and Gynecologists and the American College of Nurse-Midwives support this recommendation.  You should get the whooping cough vaccine while pregnant to pass protection to your baby frame support disabled and/or not supported in this browser  Learn why Laura decided to get the whooping cough vaccine in her 3rd trimester of pregnancy and how her baby girl was born with some protection against the disease. Also available on YouTube. After receiving the whooping cough vaccine, your body will create protective antibodies (proteins produced by the body to fight off diseases) and pass some of them to your baby before birth. These antibodies provide your baby some short-term protection against whooping cough in early life. These antibodies can also protect your baby from some of the more serious complications that come along with whooping cough. Your protective antibodies are at their highest about 2 weeks after getting the vaccine, but it takes time to pass them to your baby. So the preferred time to get the whooping cough vaccine is early in your third trimester. The amount of whooping cough antibodies in your body decreases over time. That is why CDC recommends you get a whooping cough vaccine during each pregnancy. Doing so allows each of your babies to get the greatest number of protective antibodies from you. This means each of your babies will get the best protection possible against this disease.  Getting the whooping cough vaccine while pregnant is better than getting the vaccine after you give birth Whooping cough vaccination during   pregnancy is ideal so your baby will have short-term protection as soon as he is born. This early protection is important because your baby will not start getting his whooping cough vaccines until he is 2 months old. These first few months of life are when your baby is at greatest risk for catching whooping cough. This is also when he's at  greatest risk for having severe, potentially life-threating complications from the infection. To avoid that gap in protection, it is best to get a whooping cough vaccine during pregnancy. You will then pass protection to your baby before he is born. To continue protecting your baby, he should get whooping cough vaccines starting at 2 months old. You may never have gotten the Tdap vaccine before and did not get it during this pregnancy. If so, you should make sure to get the vaccine immediately after you give birth, before leaving the hospital or birthing center. It will take about 2 weeks before your body develops protection (antibodies) in response to the vaccine. Once you have protection from the vaccine, you are less likely to give whooping cough to your newborn while caring for him. But remember, your baby will still be at risk for catching whooping cough from others. A recent study looked to see how effective Tdap was at preventing whooping cough in babies whose mothers got the vaccine while pregnant or in the hospital after giving birth. The study found that getting Tdap between 27 through 36 weeks of pregnancy is 85% more effective at preventing whooping cough in babies younger than 2 months old. Blood tests cannot tell if you need a whooping cough vaccine There are no blood tests that can tell you if you have enough antibodies in your body to protect yourself or your baby against whooping cough. Even if you have been sick with whooping cough in the past or previously received the vaccine, you still should get the vaccine during each pregnancy. Breastfeeding may pass some protective antibodies onto your baby By breastfeeding, you may pass some antibodies you have made in response to the vaccine to your baby. When you get a whooping cough vaccine during your pregnancy, you will have antibodies in your breast milk that you can share with your baby as soon as your milk comes in. However, your baby will not  get protective antibodies immediately if you wait to get the whooping cough vaccine until after delivering your baby. This is because it takes about 2 weeks for your body to create antibodies. Learn more about the health benefits of breastfeeding.  

## 2021-05-11 MED ORDER — NOVOLOG FLEXPEN 100 UNIT/ML ~~LOC~~ SOPN: 17.0000 [IU] | PEN_INJECTOR | Freq: Three times a day (TID) | SUBCUTANEOUS | 11 refills | Status: DC

## 2021-05-11 NOTE — Addendum Note (Signed)
Addended by: Jaynie Collins A on: 05/11/2021 09:00 AM   Modules accepted: Orders

## 2021-05-17 ENCOUNTER — Telehealth (INDEPENDENT_AMBULATORY_CARE_PROVIDER_SITE_OTHER): Payer: Medicaid Other | Admitting: Obstetrics and Gynecology

## 2021-05-17 ENCOUNTER — Ambulatory Visit: Payer: Medicaid Other | Attending: Obstetrics and Gynecology

## 2021-05-17 ENCOUNTER — Ambulatory Visit: Payer: Medicaid Other | Attending: Obstetrics | Admitting: Obstetrics

## 2021-05-17 ENCOUNTER — Ambulatory Visit: Payer: Medicaid Other | Admitting: *Deleted

## 2021-05-17 ENCOUNTER — Encounter: Payer: Self-pay | Admitting: *Deleted

## 2021-05-17 ENCOUNTER — Other Ambulatory Visit: Payer: Self-pay

## 2021-05-17 ENCOUNTER — Other Ambulatory Visit: Payer: Self-pay | Admitting: *Deleted

## 2021-05-17 VITALS — BP 130/68 | HR 97

## 2021-05-17 DIAGNOSIS — O34219 Maternal care for unspecified type scar from previous cesarean delivery: Secondary | ICD-10-CM

## 2021-05-17 DIAGNOSIS — E669 Obesity, unspecified: Secondary | ICD-10-CM

## 2021-05-17 DIAGNOSIS — O24414 Gestational diabetes mellitus in pregnancy, insulin controlled: Secondary | ICD-10-CM

## 2021-05-17 DIAGNOSIS — O24112 Pre-existing diabetes mellitus, type 2, in pregnancy, second trimester: Secondary | ICD-10-CM

## 2021-05-17 DIAGNOSIS — O4402 Placenta previa specified as without hemorrhage, second trimester: Secondary | ICD-10-CM

## 2021-05-17 DIAGNOSIS — E119 Type 2 diabetes mellitus without complications: Secondary | ICD-10-CM | POA: Diagnosis not present

## 2021-05-17 DIAGNOSIS — Z3A26 26 weeks gestation of pregnancy: Secondary | ICD-10-CM

## 2021-05-17 DIAGNOSIS — Z794 Long term (current) use of insulin: Secondary | ICD-10-CM

## 2021-05-17 DIAGNOSIS — O4400 Placenta previa specified as without hemorrhage, unspecified trimester: Secondary | ICD-10-CM | POA: Insufficient documentation

## 2021-05-17 DIAGNOSIS — O99212 Obesity complicating pregnancy, second trimester: Secondary | ICD-10-CM | POA: Diagnosis not present

## 2021-05-17 DIAGNOSIS — O43212 Placenta accreta, second trimester: Secondary | ICD-10-CM

## 2021-05-17 DIAGNOSIS — Z362 Encounter for other antenatal screening follow-up: Secondary | ICD-10-CM | POA: Diagnosis not present

## 2021-05-17 DIAGNOSIS — Z7984 Long term (current) use of oral hypoglycemic drugs: Secondary | ICD-10-CM

## 2021-05-17 DIAGNOSIS — O99210 Obesity complicating pregnancy, unspecified trimester: Secondary | ICD-10-CM

## 2021-05-17 DIAGNOSIS — O4403 Placenta previa specified as without hemorrhage, third trimester: Secondary | ICD-10-CM

## 2021-05-17 DIAGNOSIS — O24312 Unspecified pre-existing diabetes mellitus in pregnancy, second trimester: Secondary | ICD-10-CM

## 2021-05-17 DIAGNOSIS — O0990 Supervision of high risk pregnancy, unspecified, unspecified trimester: Secondary | ICD-10-CM

## 2021-05-17 NOTE — Progress Notes (Signed)
TELEHEALTH OBSTETRICS VISIT ENCOUNTER NOTE  Provider [facility]: [facility]   Patient location: Home  I connected with Haley [name] on 05/17/21 at 10:15 AM EDT by telephone at home and verified that I am speaking with the correct person using two identifiers. Of note, unable to do video encounter due to technical difficulties.    I discussed the limitations, risks, security and privacy concerns of performing an evaluation and management service by telephone and the availability of in person appointments. I also discussed with the patient that there may be a patient responsible charge related to this service. The patient expressed understanding and agreed to proceed.  Subjective:  Haley [name] is a 30 y.o. G2P1001 at [redacted]w[redacted]d being followed for ongoing prenatal care.  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  Patient reports no complaints. Reports fetal movement. Denies any contractions, bleeding or leaking of fluid.   The following portions of the patient's history were reviewed and updated as appropriate: allergies, current medications, past family history, past medical history, past social history, past surgical history and problem list.   Objective:  Last menstrual period 11/05/2020, currently breastfeeding. General:  Alert, oriented and cooperative.   Mental Status: Normal mood and affect perceived. Normal judgment and thought content.  Rest of physical exam deferred  due to type of encounter  Assessment and Plan:  Pregnancy: G2P1001 at 100w6d 1. Previous cesarean section complicating pregnancy, antepartum condition or complication Has rpt u/s today.   2. Pre-existing diabetes mellitus during pregnancy in second trimester Pt confirms on NPH 13/13 and aspart 17 tid and metformin 1000 bid AM fastings in the 130s-150s 2h postprandials in the 130s  Patient frustrated why AM sugars are elevated as she does not snack after midnight; I told her I wonder about dawn effect. I told her I recommend 0300 cbg since she gets up to void at night anyways and pt to inbasket [place] the values. If low then may need to snack at that time. If high, then will need to increase her qhs NPH. I also recommend to increase her aspart to 19 with each meal   Follow up 6/30 fetal echo Follow up rpt growth u/s for tomorrow  3. Supervision of high risk pregnancy, antepartum Patient to inbasket [place] her VS  4. [redacted] weeks gestation of pregnancy  5. Pre-existing type 2 diabetes mellitus during pregnancy in second trimester  6. 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 Obesity (BMI 30-39.9)  Preterm labor symptoms and general obstetric precautions including but not limited to vaginal bleeding, contractions, leaking of fluid and fetal movement were reviewed in detail with the patient.  I discussed the assessment and treatment plan with the patient. The patient was provided an opportunity to ask questions and all were answered. The patient agreed with the plan and demonstrated an understanding of the instructions. The patient was advised to call back or seek an in-person office evaluation/go to MAU at [facility] for any urgent or concerning symptoms. Please refer to After Visit Summary for other counseling  recommendations.   I provided 10 minutes of non-face-to-face time during this encounter.  No follow-ups on file.  Future  Appointments  Date Time Provider Department Center  05/17/2021  2:45 PM [facility] NURSE [facility]  05/17/2021  3:00 PM [facility] US1 [facility]  05/31/2021  9:30 AM Haley Bores, MD [facility]    Haley Bing, MD [facility]

## 2021-05-17 NOTE — Progress Notes (Signed)
MFM Note  This patient was seen for a follow up exam due to placenta previa and possible placenta accreta that was noted on her prior ultrasound exam.  Her pregnancy has also been complicated by maternal obesity with a BMI of 34 and pregestational diabetes that is currently treated with metformin and insulin.  She denies any vaginal bleeding and denies any problems since her last exam.  She has a history of one prior cesarean delivery.  She was informed that the fetal growth measures large for her gestational age (95th percentile).  There was normal amniotic fluid noted.  An anterior placenta previa continues to be noted on today's exam.  Multiple lacunae continue to be noted within the anterior placenta, increasing the suspicion for placenta accreta.  The lacunae appear to be most prominent around the area of her prior C-section scar.  There did not appear to be any placental invasion into her bladder.  The patient was advised that we will continue to follow her closely to assess the placenta.  Should placenta accreta continue to be suspected during her next exam, she understands that she may have to transfer her care for delivery to another institution where GYN oncology is available.  She was advised that in cases of placenta accreta, delivery is usually recommended at around 34 to 35 weeks.  She should probably receive a course of antenatal corticosteroids prior to delivery.  She understands that a cesarean hysterectomy may be necessary.  The increased risk of hemorrhage and the need for transfusion of blood and blood products at the time of delivery was also discussed today.  Pelvic rest was advised.  She was advised to go to the hospital for evaluation should she experience any vaginal bleeding.  A follow up exam was scheduled in 4 weeks.  Due to pregestational diabetes, we will also start weekly biophysical profiles at that time.    A total of 15 minutes was spent counseling and coordinating the  care for this patient.  Greater than 50% of the time was spent in direct face-to-face contact.

## 2021-05-17 NOTE — Progress Notes (Signed)
I connected with  Haley [name] on 05/17/21 at 10:15 AM EDT by telephone and verified that I am speaking with the correct person using two identifiers.   I discussed the limitations, risks, security and privacy concerns of performing an evaluation and management service by telephone and the availability of in person appointments. I also discussed with the patient that there may be a patient responsible charge related to this service. The patient expressed understanding and agreed to proceed.  Scheryl Marten, RN 05/17/2021  10:14 AM

## 2021-05-19 MED ORDER — NOVOLOG FLEXPEN 100 UNIT/ML ~~LOC~~ SOPN: 19.0000 [IU] | PEN_INJECTOR | Freq: Three times a day (TID) | SUBCUTANEOUS | 11 refills | Status: DC

## 2021-05-23 ENCOUNTER — Other Ambulatory Visit: Payer: Self-pay | Admitting: *Deleted

## 2021-05-23 DIAGNOSIS — O24312 Unspecified pre-existing diabetes mellitus in pregnancy, second trimester: Secondary | ICD-10-CM

## 2021-05-23 MED ORDER — INSULIN NPH (HUMAN) (ISOPHANE) 100 UNIT/ML ~~LOC~~ SUSP: 13.0000 [IU] | Freq: Two times a day (BID) | SUBCUTANEOUS | 11 refills | Status: AC

## 2021-05-23 NOTE — Progress Notes (Signed)
Pt broke her bottle and pharmacy asked [place] to send in another RX and they will override it and refill he Humulin early.

## 2021-05-31 ENCOUNTER — Ambulatory Visit (INDEPENDENT_AMBULATORY_CARE_PROVIDER_SITE_OTHER): Payer: Medicaid Other | Admitting: Family Medicine

## 2021-05-31 ENCOUNTER — Other Ambulatory Visit: Payer: Self-pay

## 2021-05-31 VITALS — BP 111/72 | HR 98 | Wt 195.0 lb

## 2021-05-31 DIAGNOSIS — O0993 Supervision of high risk pregnancy, unspecified, third trimester: Secondary | ICD-10-CM | POA: Diagnosis not present

## 2021-05-31 DIAGNOSIS — O4403 Placenta previa specified as without hemorrhage, third trimester: Secondary | ICD-10-CM

## 2021-05-31 DIAGNOSIS — O0990 Supervision of high risk pregnancy, unspecified, unspecified trimester: Secondary | ICD-10-CM

## 2021-05-31 DIAGNOSIS — O4402 Placenta previa specified as without hemorrhage, second trimester: Secondary | ICD-10-CM

## 2021-05-31 DIAGNOSIS — O34219 Maternal care for unspecified type scar from previous cesarean delivery: Secondary | ICD-10-CM

## 2021-05-31 DIAGNOSIS — O24112 Pre-existing diabetes mellitus, type 2, in pregnancy, second trimester: Secondary | ICD-10-CM

## 2021-05-31 DIAGNOSIS — O24113 Pre-existing diabetes mellitus, type 2, in pregnancy, third trimester: Secondary | ICD-10-CM

## 2021-05-31 DIAGNOSIS — Z3A30 30 weeks gestation of pregnancy: Secondary | ICD-10-CM

## 2021-05-31 NOTE — Progress Notes (Signed)
Tdap next visit

## 2021-05-31 NOTE — Patient Instructions (Signed)

## 2021-05-31 NOTE — Progress Notes (Signed)
   PRENATAL VISIT NOTE  Subjective:  Haley [name] is a 30 y.o. G2P1001 at [redacted]w[redacted]d being seen today for ongoing prenatal care.  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  Patient reports no complaints.  Contractions: Regular. Vag. Bleeding: None.  Movement: Present. Denies leaking of fluid.   The following portions of the patient's history were reviewed and updated as appropriate: allergies, current medications, past family history, past medical history, past social history, past surgical history and problem list.   Objective:   Vitals:   05/31/21 0937  BP: 111/72  Pulse: 98  Weight: 195 lb (88.5 kg)    Fetal Status: Fetal Heart Rate (bpm): 141 Fundal Height: 34 cm Movement: Present     General:  Alert, oriented and cooperative. Patient is in no acute distress.  Skin: Skin is warm and dry. No rash noted.   Cardiovascular: Normal heart rate noted  Respiratory: Normal respiratory effort, no problems with respiration noted  Abdomen: Soft, gravid, appropriate for gestational age.  Pain/Pressure: Absent     Pelvic: Cervical exam deferred        Extremities: Normal range of motion.  Edema: None  Mental Status: Normal mood and affect. Normal behavior. Normal judgment and thought content.   Assessment and Plan:  Pregnancy: G2P1001 at [redacted]w[redacted]d 1. Pre-existing type 2 diabetes mellitus during pregnancy in second trimester FBS 113-168 sometimes dropping overnight 2 hour pp 50-189 On insulin, lunch is in range, but  breakfast and dinners are out. She knows she has some dietary indiscretions--fixing this. Consider increasing Novolog to 21 at breakfast and 22 at dinner. Add protein to bedside snack to prevent Somogyi effect vs. Dawn effect  - Comprehensive metabolic panel - Hemoglobin A1c  2. Previous cesarean section complicating pregnancy, antepartum condition or complication   3. Supervision of high risk pregnancy, antepartum 28 wk labs - CBC - RPR - HIV Antibody (routine testing w rflx)  4. 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 Multiple lacunae noted at site of prior  C-section  Preterm labor symptoms and general obstetric precautions including but not limited to vaginal bleeding, contractions, leaking of fluid and fetal movement were reviewed in detail with the patient. Please refer to After Visit Summary for other counseling recommendations.   Return in 2 weeks (on 06/14/2021).  Future Appointments  Date Time Provider Department Center  06/14/2021 10:45 AM [facility] NURSE [facility]  06/14/2021 11:00 AM [facility] US1 [facility]  06/21/2021 10:30 AM [facility] NURSE [facility]  06/21/2021 10:45 AM [facility] US4 [facility]    Reva Bores, MD

## 2021-06-01 DIAGNOSIS — O24312 Unspecified pre-existing diabetes mellitus in pregnancy, second trimester: Secondary | ICD-10-CM

## 2021-06-01 LAB — COMPREHENSIVE METABOLIC PANEL
ALT: 9 IU/L (ref 0–32)
AST: 12 IU/L (ref 0–40)
Albumin/Globulin Ratio: 1.3 (ref 1.2–2.2)
Albumin: 3.5 g/dL — ABNORMAL LOW (ref 3.9–5.0)
Alkaline Phosphatase: 74 IU/L (ref 44–121)
BUN/Creatinine Ratio: 15 (ref 9–23)
BUN: 7 mg/dL (ref 6–20)
Bilirubin Total: <0.2 mg/dL (ref 0.0–1.2)
CO2: 18 mmol/L — ABNORMAL LOW (ref 20–29)
Calcium: 8.8 mg/dL (ref 8.7–10.2)
Chloride: 103 mmol/L (ref 96–106)
Creatinine, Ser: 0.46 mg/dL — ABNORMAL LOW (ref 0.57–1.00)
Globulin, Total: 2.7 g/dL (ref 1.5–4.5)
Glucose: 170 mg/dL — ABNORMAL HIGH (ref 65–99)
Potassium: 4.2 mmol/L (ref 3.5–5.2)
Sodium: 135 mmol/L (ref 134–144)
Total Protein: 6.2 g/dL (ref 6.0–8.5)
eGFR: 132 mL/min/{1.73_m2} (ref >59)

## 2021-06-01 LAB — CBC
Hematocrit: 33.4 % — ABNORMAL LOW (ref 34.0–46.6)
Hemoglobin: 10.7 g/dL — ABNORMAL LOW (ref 11.1–15.9)
MCH: 23.6 pg — ABNORMAL LOW (ref 26.6–33.0)
MCHC: 32 g/dL (ref 31.5–35.7)
MCV: 74 fL — ABNORMAL LOW (ref 79–97)
Platelets: 220 10*3/uL (ref 150–450)
RBC: 4.54 x10E6/uL (ref 3.77–5.28)
RDW: 14.4 % (ref 11.7–15.4)
WBC: 9.2 10*3/uL (ref 3.4–10.8)

## 2021-06-01 LAB — HEMOGLOBIN A1C
Est. average glucose Bld gHb Est-mCnc: 163 mg/dL
Hgb A1c MFr Bld: 7.3 % — ABNORMAL HIGH (ref 4.8–5.6)

## 2021-06-01 LAB — HIV ANTIBODY (ROUTINE TESTING W REFLEX): HIV Screen 4th Generation wRfx: NONREACTIVE

## 2021-06-01 LAB — RPR: RPR Ser Ql: NONREACTIVE

## 2021-06-05 MED ORDER — NOVOLOG FLEXPEN 100 UNIT/ML ~~LOC~~ SOPN: 19.0000 [IU] | PEN_INJECTOR | Freq: Three times a day (TID) | SUBCUTANEOUS | 11 refills | Status: AC

## 2021-06-14 ENCOUNTER — Other Ambulatory Visit: Payer: Self-pay

## 2021-06-14 ENCOUNTER — Ambulatory Visit (HOSPITAL_BASED_OUTPATIENT_CLINIC_OR_DEPARTMENT_OTHER): Payer: Medicaid Other | Admitting: Obstetrics and Gynecology

## 2021-06-14 ENCOUNTER — Telehealth (INDEPENDENT_AMBULATORY_CARE_PROVIDER_SITE_OTHER): Payer: Medicaid Other | Admitting: Obstetrics and Gynecology

## 2021-06-14 ENCOUNTER — Ambulatory Visit: Payer: Medicaid Other | Attending: Obstetrics

## 2021-06-14 ENCOUNTER — Ambulatory Visit: Payer: Medicaid Other | Admitting: *Deleted

## 2021-06-14 ENCOUNTER — Other Ambulatory Visit: Payer: Self-pay | Admitting: *Deleted

## 2021-06-14 ENCOUNTER — Encounter: Payer: Self-pay | Admitting: Obstetrics and Gynecology

## 2021-06-14 ENCOUNTER — Encounter: Payer: Self-pay | Admitting: *Deleted

## 2021-06-14 VITALS — BP 135/73 | HR 95

## 2021-06-14 DIAGNOSIS — O4403 Placenta previa specified as without hemorrhage, third trimester: Secondary | ICD-10-CM

## 2021-06-14 DIAGNOSIS — Z3A30 30 weeks gestation of pregnancy: Secondary | ICD-10-CM

## 2021-06-14 DIAGNOSIS — O4402 Placenta previa specified as without hemorrhage, second trimester: Secondary | ICD-10-CM | POA: Diagnosis present

## 2021-06-14 DIAGNOSIS — O34219 Maternal care for unspecified type scar from previous cesarean delivery: Secondary | ICD-10-CM

## 2021-06-14 DIAGNOSIS — O0990 Supervision of high risk pregnancy, unspecified, unspecified trimester: Secondary | ICD-10-CM

## 2021-06-14 DIAGNOSIS — O99213 Obesity complicating pregnancy, third trimester: Secondary | ICD-10-CM

## 2021-06-14 DIAGNOSIS — O403XX0 Polyhydramnios, third trimester, not applicable or unspecified: Secondary | ICD-10-CM | POA: Diagnosis not present

## 2021-06-14 DIAGNOSIS — O24112 Pre-existing diabetes mellitus, type 2, in pregnancy, second trimester: Secondary | ICD-10-CM | POA: Insufficient documentation

## 2021-06-14 DIAGNOSIS — Z794 Long term (current) use of insulin: Secondary | ICD-10-CM

## 2021-06-14 DIAGNOSIS — Z362 Encounter for other antenatal screening follow-up: Secondary | ICD-10-CM

## 2021-06-14 DIAGNOSIS — E669 Obesity, unspecified: Secondary | ICD-10-CM

## 2021-06-14 DIAGNOSIS — R8271 Bacteriuria: Secondary | ICD-10-CM

## 2021-06-14 DIAGNOSIS — E119 Type 2 diabetes mellitus without complications: Secondary | ICD-10-CM

## 2021-06-14 DIAGNOSIS — O24113 Pre-existing diabetes mellitus, type 2, in pregnancy, third trimester: Secondary | ICD-10-CM

## 2021-06-14 DIAGNOSIS — O99820 Streptococcus B carrier state complicating pregnancy: Secondary | ICD-10-CM

## 2021-06-14 NOTE — Progress Notes (Signed)
Maternal-Fetal Medicine   Name: Anarie Kalish DOB: Oct 09, 1991 MRN: [id number] Referring Provider: Tinnie Gens, MD  I had the pleasure of seeing Ms. Mckinlay [facility] at [facility]. She is G2 P1001 at 30w 6d gestation with pregestational diabetes and placenta previa.  Patient takes insulin and metformin, and reports that her diabetes is still not well controlled.   On fetal echocardiography (performed at [facility]) the study was reported as normal.  However, the left ventricle was smaller than the right but with a normal mitral valve.  Patient informed that she had a discussion with pediatric cardiologist about delivering at [facility].  However, there is no clear recommendation about delivering at [facility].  Obstetric history is significant for a term cesarean delivery. Blood pressure today at her office is 135/73 mmHg. Ultrasound On today's ultrasound, the estimated fetal weight is at the 97th percentile and the abdominal circumference measurement is at the 99th percentile.  Mild polyhydramnios is seen (AFI 26 cm).  Good fetal activity is present.  Antenatal testing is reassuring.  BPP 8/8. On transabdominal scan multiple lacunae were seen in the anterior placenta.  Myometrial-placental interface is not clearly defined anteriorly in some areas.   After explaining, we performed a transvaginal ultrasound with partially full bladder to evaluate the placenta.  The cervix appears normal, and the placenta is seen covering the internal os and extending posteriorly.  There is no parametrial invasion.  Myometrial bladder interface appears normal with no bridging vessels.  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 -Bladder-myometrial interface appears normal. No bladder or parametrial invasion is suspected. - I counseled the  patient if PAS is suspected, providers would recommend delivery at an outside [facility] facility. -I explained that ultrasound has limitations in accurately diagnosing PAS.  MRI is useful in cases of posterior placenta previa and may not add additional information. -I explained that my ultrasound findings warrant delivery at the Center via GYN oncology services available. -She understand that cesarean hysterectomy will be performed if placenta accreta spectrum is confirmed at surgery. -Recent hemoglobin is 10.7 g/dL.  Recommendations -Continue weekly BPP till transfer of her care. -Patient will call us tomorrow about her choice of place of delivery.   Thank you for consultation.  If you have any questions or concerns, please contact me the [facility].  Consultation including face-to-face (more than 50%) counseling 30 minutes.

## 2021-06-14 NOTE — Progress Notes (Signed)
OBSTETRICS PRENATAL VIRTUAL VISIT ENCOUNTER NOTE  Provider location: Center for [facility]   Patient location: Home  I connected with Haley [name] on 06/14/21 at  1:00 PM EDT by MyChart Video Encounter and verified that I am speaking with the correct person using two identifiers. I discussed the limitations, risks, security and privacy concerns of performing an evaluation and management service virtually and the availability of in person appointments. I also discussed with the patient that there may be a patient responsible charge related to this service. The patient expressed understanding and agreed to proceed. Subjective:  Haley [name] is a 30 y.o. G2P1001 at [redacted]w[redacted]d being seen today for ongoing prenatal care.  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  Patient reports no complaints.  Contractions: Not present. Vag. Bleeding: None.  Movement: Present. Denies any leaking of fluid.   The following portions of the patient's history were reviewed and updated as appropriate: allergies, current medications, past family history, past medical history, past social history, past surgical history and problem list.   Objective:  There were no vitals filed for this visit.  Fetal Status:     Movement: Present     General:  Alert, oriented and cooperative. Patient is in no acute distress.  Respiratory: Normal respiratory effort, no problems with respiration noted  Mental Status: Normal mood and  affect. Normal behavior. Normal judgment and thought content.  Rest of physical exam deferred due to type of encounter  Imaging: [place] MFM OB FOLLOW UP  Result Date: 05/17/2021 ----------------------------------------------------------------------  OBSTETRICS REPORT                       (Signed Final 05/17/2021 05:13 pm) ---------------------------------------------------------------------- Patient Info  ID #:       [id number]                          D.O.B.:  Feb 15, 1991 (30 yrs)  Name:       Haley [name]                    Visit Date: 05/17/2021 03:52 pm ---------------------------------------------------------------------- Performed By  Attending:        Ma Rings MD         Ref. Address:     [address]                                                             [place], [place]  [address]  Performed By:     Emeline Darling BS,      [facility]  Referred By:      Reva Bores                    MD ---------------------------------------------------------------------- Orders  #  Description                           Code        Ordered By  1  [place] MFM OB FOLLOW UP                   [id number]    Noralee Space ----------------------------------------------------------------------  #  Order #                     Accession #                Episode #  1  [id number]                   [id number]                 [id number] ---------------------------------------------------------------------- Indications  Pre-existing diabetes, type 2, in pregnancy,   O24.112  second trimester (Insulin and Metformin)  Placenta previa specified as without           O44.02  hemorrhage,  second trimester  [redacted] weeks gestation of pregnancy                Z3A.26  Obesity complicating pregnancy, second         O99.212  trimester (BMI 34)  Antenatal follow-up for nonvisualized fetal    Z36.2  anatomy  History of cesarean delivery, currently        O34.219  pregnant ---------------------------------------------------------------------- Fetal Evaluation  Num Of Fetuses:         1  Fetal Heart Rate(bpm):  166  Cardiac Activity:       Observed  Presentation:           Cephalic  Placenta:               Anterior previa  P. Cord Insertion:      Not well visualized  Amniotic Fluid  AFI FV:      Within normal limits ---------------------------------------------------------------------- Biometry  BPD:      72.7  mm     G. Age:  29w 1d         96  %    CI:        80.23   %    70 - 86                                                          FL/HC:      20.0   %    18.6 - 20.4  HC:      256.4  mm     G. Age:  27w 6d         56  %    HC/AC:      1.02        1.05 - 1.21  AC:      251.5  mm     G. Age:  29w 2d         96  %    FL/BPD:     70.6   %    71 - 87  FL:       51.3  mm     G. Age:  27w 3d         53  %    FL/AC:      20.4   %    20 - 24  Est. FW:    1248  gm    2 lb 12 oz      95  % ---------------------------------------------------------------------- OB History  Gravidity:    2  Living:       1 ---------------------------------------------------------------------- Gestational Age  LMP:           27w 4d        Date:  11/05/20                 EDD:   08/12/21  U/S Today:     28w 3d                                        EDD:   08/06/21  Best:          26w 6d     Det. By:  U/S  (03/21/21)          EDD:   08/17/21 ---------------------------------------------------------------------- Anatomy  Cranium:               Appears normal         LVOT:                   Previously seen  Cavum:                 Appears normal         Aortic Arch:            Previously seen  Ventricles:            Previously seen        Ductal  Arch:            Previously seen  Choroid Plexus:        Previously seen        Diaphragm:  Appears normal  Cerebellum:            Previously seen        Stomach:                Appears normal, left                                                                        sided  Posterior Fossa:       Previously seen        Abdomen:                Appears normal  Nuchal Fold:           Previously seen        Abdominal Wall:         Previously seen  Face:                  Orbits and profile     Cord Vessels:           Previously seen                         previously seen  Lips:                  Previously seen        Kidneys:                Appear normal  Palate:                Previously seen        Bladder:                Appears normal  Thoracic:              Appears normal         Spine:                  Previously seen  Heart:                 Appears normal         Upper Extremities:      Previously seen                         (4CH, axis, and                         situs)  RVOT:                  Previously seen        Lower Extremities:      Previously seen  Other:  IVC/SVC, Heels, left open hand/5th digit, Lenses, Nasal bone          previously visualized. Technically difficult due to fetal position. ---------------------------------------------------------------------- Cervix Uterus Adnexa  Cervix  Normal appearance by transabdominal scan. ---------------------------------------------------------------------- Comments  This patient was seen for a follow up exam due to placenta  previa and possible placenta accreta that was noted on her  prior ultrasound exam.  Her pregnancy has also been  complicated by maternal obesity with a BMI of 34 and  pregestational diabetes that is currently treated with  metformin and insulin.  She denies any vaginal bleeding and  denies any problems since her last exam.  She has a history  of one prior cesarean delivery.  She was informed that the fetal growth measures  large for  her gestational age (95th percentile).  There was normal  amniotic fluid noted.  An anterior placenta previa continues to be noted on today's  exam.  Multiple lacunae continue to be noted within the  anterior placenta, increasing the suspicion for placenta  accreta.  The lacunae appear to be most prominent around  the area of her prior C-section scar.  There did not appear to  be any placental invasion into her bladder.  The patient was advised that we will continue to follow her  closely to assess the placenta.  Should placenta accreta  continue to be suspected during her next exam, she  understands that she may have to transfer her care for  delivery to another institution where GYN oncology is  available.  She was advised that in cases of placenta accreta,  delivery is usually recommended at around 34 to 35 weeks.  She should probably receive a course of antenatal  corticosteroids prior to delivery.  She understands that a  cesarean hysterectomy may be necessary.  The increased  risk of hemorrhage and the need for transfusion of blood and  blood products at the time of delivery was also discussed  today.  Pelvic rest was advised.  She was advised to go to the  hospital for evaluation should she experience any vaginal  bleeding.  A follow up exam was scheduled in 4 weeks.  Due to  pregestational diabetes, we will also start weekly biophysical  profiles at that time.  A total of 15 minutes was spent counseling and coordinating  the care for this patient.  Greater than 50% of the time was  spent in direct face-to-face contact. ----------------------------------------------------------------------                   Ma Rings, MD Electronically Signed Final Report   05/17/2021 05:13 pm ----------------------------------------------------------------------   Assessment and Plan:  Pregnancy: G2P1001 at [redacted]w[redacted]d 1. Supervision of high risk pregnancy, antepartum Patient is doing well without  complaints  2. Pre-existing type 2 diabetes mellitus during pregnancy in second trimester CBGs reviewed and reports fasting as high as 187 with most values between 130-140. Post breakfast as high as 150 and most in the 130. Post lunch majority with range. Post dinner high 190 and most no higher than 123 Patient ran out of her novolog for the past week and plans to pick up today. As a result she has significantly decreased her carbohydrate consumption. Patient plans to continue on current insulin dose and may increase novolog to 21 with breakfast and 22 with dinner if values are persistently elevated Follow up ultrasound and weekly antenatal testing scheduled per MFM  3. 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 Report not yet available but patient was informed no evidence of placenta accreta  4. GBS bacteriuria   5. Previous cesarean section complicating pregnancy, antepartum condition or complication Will be scheduled for repeat given placenta previa at 37 weeks  Preterm labor symptoms and general obstetric precautions including but not limited to vaginal bleeding, contractions, leaking of fluid and fetal movement were reviewed in detail with the patient. I discussed the assessment and treatment plan  with the patient. The patient was provided an opportunity to ask questions and all were answered. The patient agreed with the plan and demonstrated an understanding of the instructions. The patient was advised to call back or seek an in-person office evaluation/[facility] for any urgent or concerning symptoms. Please refer to After Visit Summary for other counseling recommendations.   I provided 15 minutes of face-to-face time during this encounter.  Return in about 2 weeks (around 06/28/2021) for ROB, High risk, Virtual.  Future Appointments  Date Time Provider Department Center  06/21/2021  10:30 AM [facility]  06/21/2021 10:45 AM [facility]  06/30/2021  7:15 AM [facility]  06/30/2021  7:30 AM [facility]  07/05/2021  9:55 AM Alvester MorinNewton, Isa RankinKimberly Niles, MD [facility]    Catalina AntiguaPeggy Taylyn Brame, MD [facility]

## 2021-06-15 ENCOUNTER — Encounter: Payer: Self-pay | Admitting: Obstetrics and Gynecology

## 2021-06-15 DIAGNOSIS — O35BXX0 Maternal care for other (suspected) fetal abnormality and damage, fetal cardiac anomalies, not applicable or unspecified: Secondary | ICD-10-CM | POA: Insufficient documentation

## 2021-06-15 DIAGNOSIS — O43213 Placenta accreta, third trimester: Secondary | ICD-10-CM | POA: Insufficient documentation

## 2021-06-15 DIAGNOSIS — O358XX0 Maternal care for other (suspected) fetal abnormality and damage, not applicable or unspecified: Secondary | ICD-10-CM | POA: Insufficient documentation

## 2021-06-15 NOTE — Telephone Encounter (Signed)
I spoke with Haley [name] (phone) today.

## 2021-06-16 ENCOUNTER — Telehealth: Payer: Medicaid Other | Admitting: Obstetrics & Gynecology

## 2021-06-21 ENCOUNTER — Ambulatory Visit: Payer: Medicaid Other | Attending: Obstetrics

## 2021-06-21 ENCOUNTER — Other Ambulatory Visit: Payer: Self-pay

## 2021-06-21 ENCOUNTER — Other Ambulatory Visit: Payer: Self-pay | Admitting: *Deleted

## 2021-06-21 ENCOUNTER — Encounter: Payer: Self-pay | Admitting: *Deleted

## 2021-06-21 ENCOUNTER — Ambulatory Visit: Payer: Medicaid Other | Admitting: *Deleted

## 2021-06-21 VITALS — BP 124/67 | HR 93

## 2021-06-21 DIAGNOSIS — O43213 Placenta accreta, third trimester: Secondary | ICD-10-CM | POA: Diagnosis present

## 2021-06-21 DIAGNOSIS — O403XX0 Polyhydramnios, third trimester, not applicable or unspecified: Secondary | ICD-10-CM | POA: Diagnosis not present

## 2021-06-21 DIAGNOSIS — O34219 Maternal care for unspecified type scar from previous cesarean delivery: Secondary | ICD-10-CM | POA: Diagnosis present

## 2021-06-21 DIAGNOSIS — E119 Type 2 diabetes mellitus without complications: Secondary | ICD-10-CM | POA: Diagnosis not present

## 2021-06-21 DIAGNOSIS — O24112 Pre-existing diabetes mellitus, type 2, in pregnancy, second trimester: Secondary | ICD-10-CM | POA: Diagnosis present

## 2021-06-21 DIAGNOSIS — O4403 Placenta previa specified as without hemorrhage, third trimester: Secondary | ICD-10-CM | POA: Insufficient documentation

## 2021-06-21 DIAGNOSIS — O24113 Pre-existing diabetes mellitus, type 2, in pregnancy, third trimester: Secondary | ICD-10-CM | POA: Diagnosis not present

## 2021-06-21 DIAGNOSIS — O99213 Obesity complicating pregnancy, third trimester: Secondary | ICD-10-CM

## 2021-06-21 DIAGNOSIS — O4402 Placenta previa specified as without hemorrhage, second trimester: Secondary | ICD-10-CM | POA: Insufficient documentation

## 2021-06-21 DIAGNOSIS — Z3A31 31 weeks gestation of pregnancy: Secondary | ICD-10-CM

## 2021-06-21 DIAGNOSIS — E669 Obesity, unspecified: Secondary | ICD-10-CM

## 2021-06-27 ENCOUNTER — Telehealth: Payer: Self-pay | Admitting: Radiology

## 2021-06-27 NOTE — Telephone Encounter (Signed)
Pt called canceled [facility] appt [facility]

## 2021-06-30 ENCOUNTER — Ambulatory Visit: Payer: Medicaid Other | Attending: Obstetrics and Gynecology

## 2021-06-30 ENCOUNTER — Ambulatory Visit: Payer: Medicaid Other | Admitting: *Deleted

## 2021-06-30 ENCOUNTER — Other Ambulatory Visit: Payer: Self-pay

## 2021-06-30 ENCOUNTER — Encounter: Payer: Self-pay | Admitting: *Deleted

## 2021-06-30 VITALS — BP 120/65 | HR 96

## 2021-06-30 DIAGNOSIS — O24112 Pre-existing diabetes mellitus, type 2, in pregnancy, second trimester: Secondary | ICD-10-CM | POA: Diagnosis present

## 2021-06-30 DIAGNOSIS — O24113 Pre-existing diabetes mellitus, type 2, in pregnancy, third trimester: Secondary | ICD-10-CM | POA: Diagnosis not present

## 2021-06-30 DIAGNOSIS — O43213 Placenta accreta, third trimester: Secondary | ICD-10-CM | POA: Diagnosis present

## 2021-06-30 DIAGNOSIS — Z3A33 33 weeks gestation of pregnancy: Secondary | ICD-10-CM

## 2021-06-30 DIAGNOSIS — O4403 Placenta previa specified as without hemorrhage, third trimester: Secondary | ICD-10-CM | POA: Insufficient documentation

## 2021-06-30 DIAGNOSIS — O4402 Placenta previa specified as without hemorrhage, second trimester: Secondary | ICD-10-CM | POA: Insufficient documentation

## 2021-06-30 DIAGNOSIS — E119 Type 2 diabetes mellitus without complications: Secondary | ICD-10-CM

## 2021-06-30 DIAGNOSIS — O99213 Obesity complicating pregnancy, third trimester: Secondary | ICD-10-CM

## 2021-06-30 DIAGNOSIS — E669 Obesity, unspecified: Secondary | ICD-10-CM

## 2021-06-30 DIAGNOSIS — O403XX0 Polyhydramnios, third trimester, not applicable or unspecified: Secondary | ICD-10-CM

## 2021-06-30 DIAGNOSIS — O34219 Maternal care for unspecified type scar from previous cesarean delivery: Secondary | ICD-10-CM | POA: Diagnosis not present

## 2021-07-05 ENCOUNTER — Telehealth: Payer: Medicaid Other | Admitting: Family Medicine

## 2021-07-05 ENCOUNTER — Ambulatory Visit: Payer: Medicaid Other

## 2021-07-12 ENCOUNTER — Ambulatory Visit: Payer: Medicaid Other

## 2022-05-05 IMAGING — US US MFM FETAL BPP W/O NON-STRESS
1 series · 12 of 28 positions shown · non-contrast
Comparison: none

[Series 1: us mfm fetal bpp w/o non-stress · 30 acquisitions, 12 frames shown]
[im 2/30]
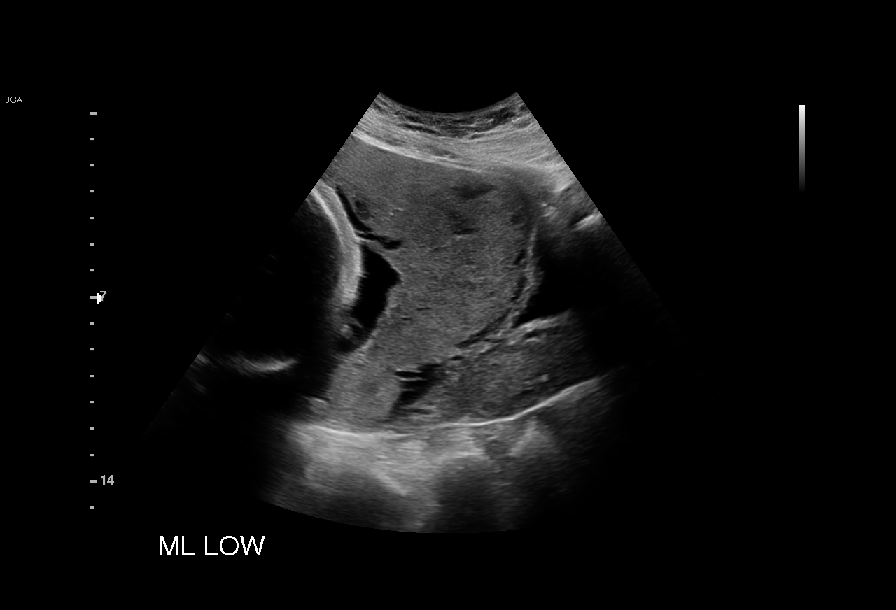
[im 4/30]
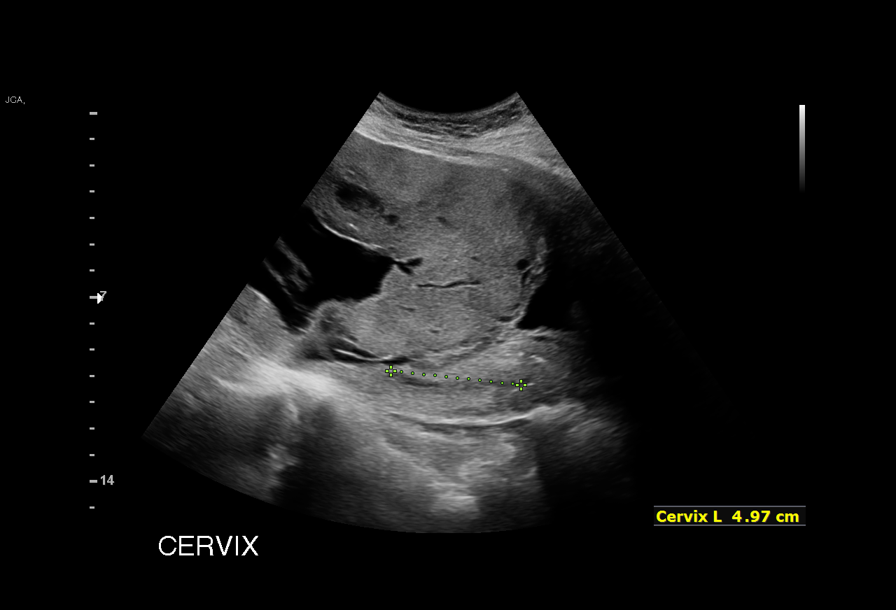
[im 6/30]
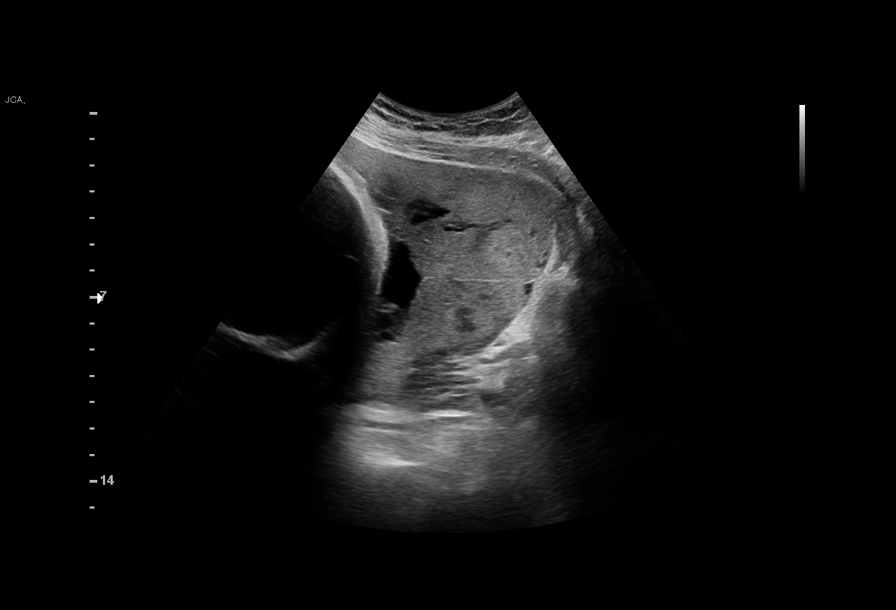
[im 9/30]
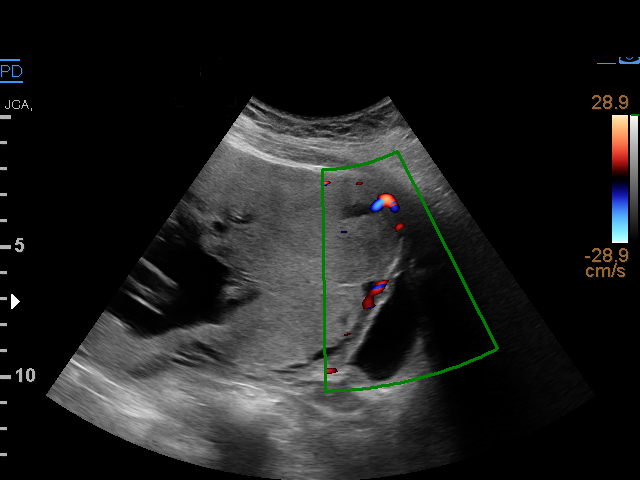
[im 11/30]
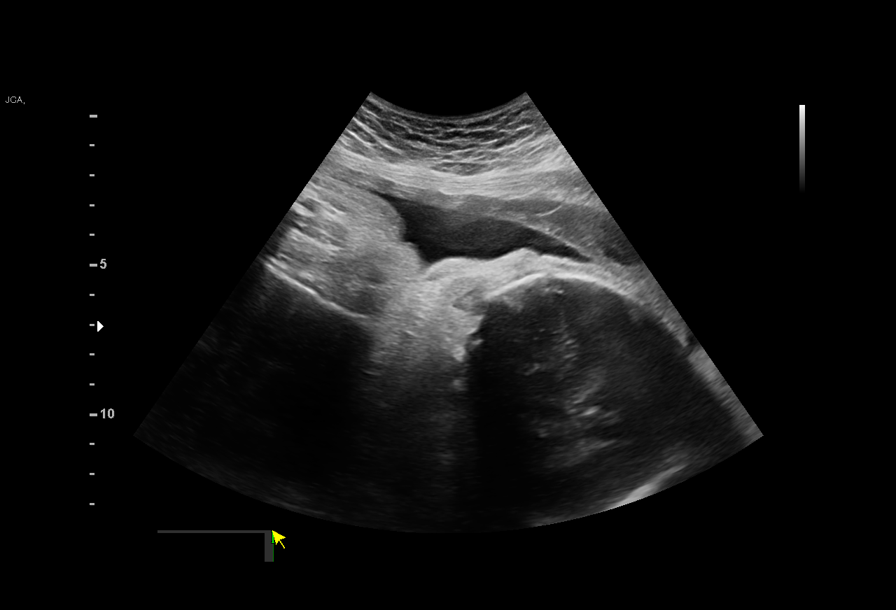
[im 13/30]
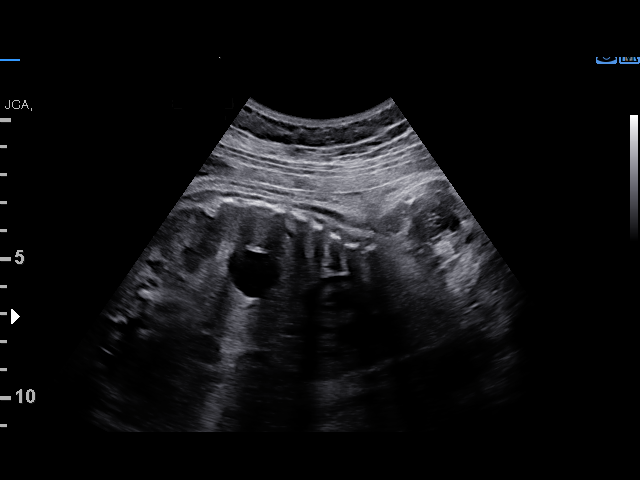
[im 17/30]
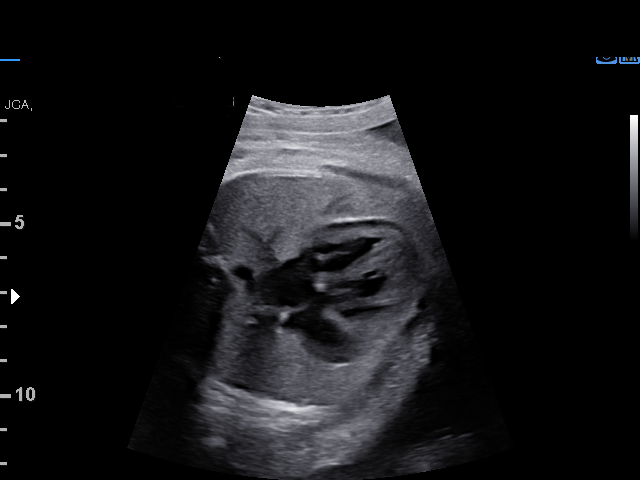
[im 19/30]
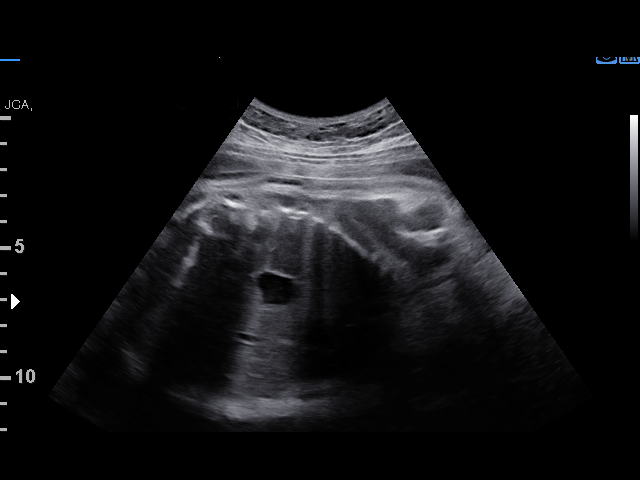
[im 21/30]
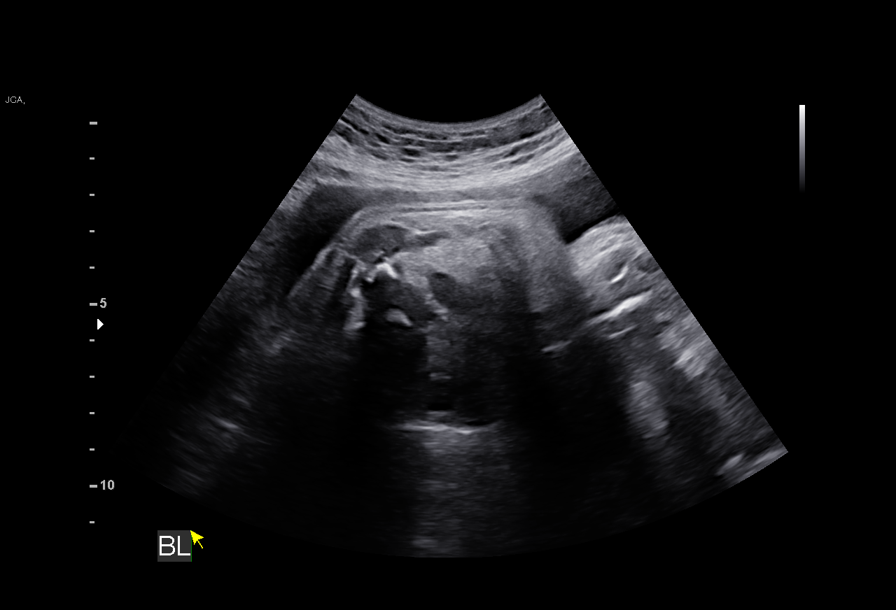
[im 24/30]
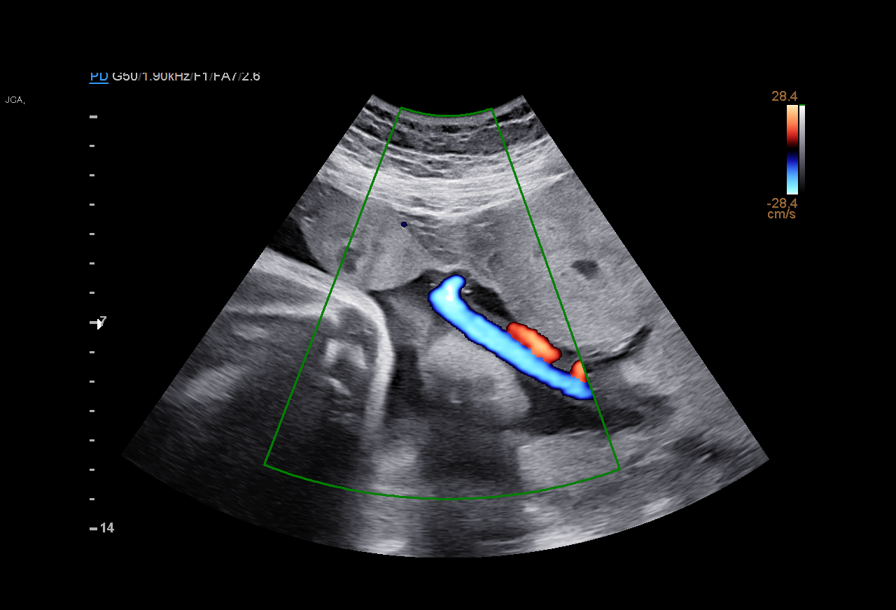
[im 26/30]
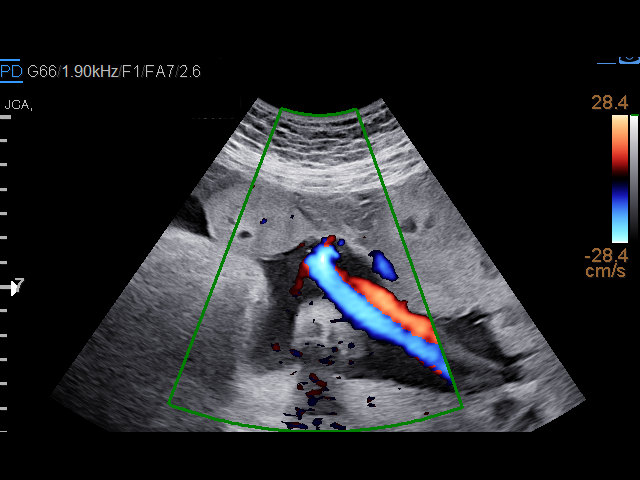
[im 28/30]
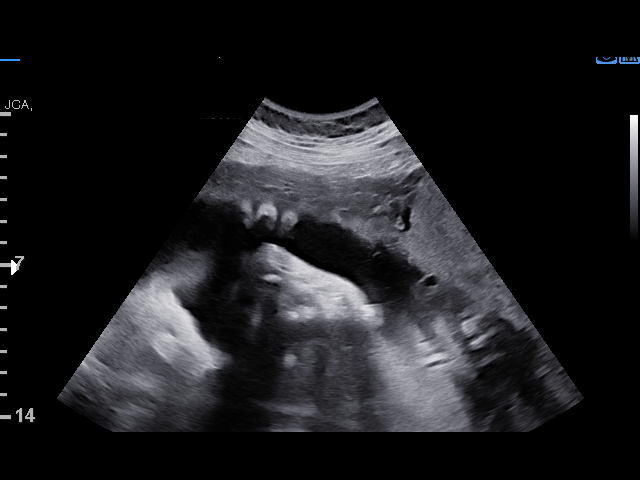

[12 of 28 positions shown; findings below may reference images not displayed]

Indications

 31 weeks gestation of pregnancy
 Pre-existing diabetes, type 2, in pregnancy,
 third trimester
 Placenta previa specified as without
 hemorrhage, third trimester
 Obesity complicating pregnancy, third
 trimester
 History of cesarean delivery, currently
 pregnant
 Polyhydramnios, third trimester, antepartum
 condition or complication, unspecified fetus
Fetal Evaluation

 Num Of Fetuses:         1
 Fetal Heart Rate(bpm):  127
 Cardiac Activity:       Observed
 Presentation:           Cephalic
 Placenta:               PLACENTA ACCRETA SPECTRUM
 P. Cord Insertion:      Previously Visualized

 Amniotic Fluid
 AFI FV:      Within normal limits

 AFI Sum(cm)     %Tile       Largest Pocket(cm)
 21.8            84
 RUQ(cm)       RLQ(cm)       LUQ(cm)        LLQ(cm)

Biophysical Evaluation

 Amniotic F.V:   Within normal limits       F. Tone:        Observed
 F. Movement:    Observed                   Score:          [DATE]
 F. Breathing:   Observed
Biometry

 LV:          4  mm
OB History

 Gravidity:    2
 Living:       1
Gestational Age

 LMP:           32w 4d        Date:  11/05/20                 EDD:   08/12/21
 Best:          31w 6d     Det. By:  U/S  (03/21/21)          EDD:   08/17/21
Anatomy

 Ventricles:            Appears normal         Kidneys:                Appear normal
 Heart:                 Appears normal         Bladder:                Appears normal
                        (4CH, axis, and
                        situs)
 Stomach:               Appears normal, left
                        sided
Cervix Uterus Adnexa

 Cervix
 Length:           4.97  cm.
 Not visualized (advanced GA >70wks)

 Right Ovary
 Visualized.

 Left Ovary
 Not visualized.
Impression

 Pregestational diabetes.  Patient takes insulin and metformin
 for control.

 Amniotic fluid is normal and good fetal activity seen.
 Antenatal testing is reassuring.  BPP [DATE]

 On today's ultrasound, placenta previa and placenta accreta
 spectrum is seen again.  Bladder wall appears a little irregular
 with some bridging vessels.

 Patient received a call from Theron Fraser, Nurse
 Navigator, Hagayo Macrouma. Patient has an appointkent
 on 06/23/21 with MFM at [place], where she will be delivering.

 We have made appointments for weekly BPP at our office for
 her convenience.
Recommendations

 -Continue weekly BPP till delivery.
                 Zendejas, Pakito

## 2023-06-27 ENCOUNTER — Encounter (HOSPITAL_BASED_OUTPATIENT_CLINIC_OR_DEPARTMENT_OTHER): Payer: Self-pay | Admitting: Emergency Medicine

## 2023-06-27 ENCOUNTER — Other Ambulatory Visit: Payer: Self-pay

## 2023-06-27 ENCOUNTER — Emergency Department (HOSPITAL_BASED_OUTPATIENT_CLINIC_OR_DEPARTMENT_OTHER)
Admission: EM | Admit: 2023-06-27 | Discharge: 2023-06-27 | Disposition: A | Discharge status: Home / Self Care | Payer: Medicaid Other | Attending: Emergency Medicine | Admitting: Emergency Medicine

## 2023-06-27 DIAGNOSIS — Z794 Long term (current) use of insulin: Secondary | ICD-10-CM | POA: Insufficient documentation

## 2023-06-27 DIAGNOSIS — S6992XA Unspecified injury of left wrist, hand and finger(s), initial encounter: Secondary | ICD-10-CM | POA: Diagnosis present

## 2023-06-27 DIAGNOSIS — S61311A Laceration without foreign body of left index finger with damage to nail, initial encounter: Secondary | ICD-10-CM

## 2023-06-27 DIAGNOSIS — Z7982 Long term (current) use of aspirin: Secondary | ICD-10-CM | POA: Diagnosis not present

## 2023-06-27 DIAGNOSIS — W260XXA Contact with knife, initial encounter: Secondary | ICD-10-CM | POA: Diagnosis not present

## 2023-06-27 DIAGNOSIS — S61211A Laceration without foreign body of left index finger without damage to nail, initial encounter: Secondary | ICD-10-CM | POA: Insufficient documentation

## 2023-06-27 NOTE — ED Provider Notes (Signed)
[place] [facility] Provider Note   CSN: [id number] Arrival date & time: 06/27/23  3664     History  Chief Complaint  Patient presents with   Laceration    Haley [name] is a 32 y.o.  who presents with a cc of laceration. She cut her left index finger.  She did this with a clean knife.  She is right-hand dominant.  She denies numbness or tingling.  She is up-to-date on her tetanus vaccination.   Laceration      Home Medications Prior to Admission medications   Medication Sig Start Date End Date Taking? Authorizing Provider  ACCU-CHEK FASTCLIX LANCETS MISC 1 Units by Percutaneous route 4 (four) times daily. 05/14/17   [place] Bing, MD  aspirin EC 81 MG tablet Take 1 tablet (81 mg total) by mouth daily. 02/17/21   Reva Bores, MD  Blood Glucose Monitoring Suppl (ACCU-CHEK NANO SMARTVIEW) w/Device KIT 1 kit by Subdermal route as directed. Check blood sugars for fasting, and two hours after breakfast, lunch and dinner (4 checks daily) 05/14/17   [place] Bing, MD  Glucagon 1 MG/0.2ML SOAJ Inject 1 mg into the skin as needed. 03/03/21   Reva Bores, MD  insulin aspart (NOVOLOG FLEXPEN) 100 UNIT/ML FlexPen Inject 19-22 Units into the skin 3 (three) times daily with meals. May increase breakfast and/or dinner depending on CBG levels 06/05/21   Reva Bores, MD  insulin NPH Human (HUMULIN N) 100 UNIT/ML injection Inject 0.13 mLs (13 Units total) into the skin 2 (two) times daily at 8 am and 10 pm. 05/23/21   Anyanwu, Jethro Bastos, MD  metFORMIN (GLUCOPHAGE) 1000 MG tablet Take 1 tablet (1,000 mg total) by mouth 2 (two) times daily with a meal. 12/01/17   Degele, Kandra Nicolas, MD  Prenatal Vit-Fe Fumarate-FA (MULTIVITAMIN-PRENATAL) 27-0.8 MG TABS tablet Take 1 tablet by mouth daily at 12 noon.    [provider]      Allergies    Patient has no known allergies.    Review of Systems   Review of Systems  Physical Exam Updated Vital Signs BP  117/88   Pulse 81   Temp 98.7 F (37.1 C) (Oral)   Resp 16   Ht 5\' 2"  (1.575 m)   Wt 73.9 kg   LMP 05/28/2023 (Exact Date)   SpO2 100%   BMI 29.81 kg/m  Physical Exam Physical Exam  Nursing note and vitals reviewed. Constitutional: She is oriented to person, place, and time. She appears well-developed and well-nourished. No distress.  HENT:  Head: Normocephalic and atraumatic.  Eyes: Conjunctivae normal and EOM are normal. Pupils are equal, round, and reactive to light. No scleral icterus.  Neck: Normal range of motion.  Cardiovascular: Normal rate, regular rhythm and normal heart sounds.  Exam reveals no gallop and no friction rub.   No murmur heard. Pulmonary/Chest: Effort normal and breath sounds normal. No respiratory distress.  Abdominal: Soft. Bowel sounds are normal. She exhibits no distension and no mass. There is no tenderness. There is no guarding.  Musculoskeletal: 2 cm straight laceration of the distal fingertip without avulsion.  Full range of motion of the finger.  No active bleeding. Neurological: She is alert and oriented to person, place, and time.  Skin: Skin is warm and dry. She is not diaphoretic.   ED Results / Procedures / Treatments   Labs (all labs ordered are listed, but only abnormal results are displayed) Labs Reviewed - No data  to display  EKG None  Radiology No results found.  Procedures .Marland KitchenLaceration Repair  Date/Time: 06/27/2023 10:35 AM  Performed by: Arthor Captain, PA-C Authorized by: Arthor Captain, PA-C   Consent:    Consent obtained:  Verbal   Consent given by:  Patient   Risks, benefits, and alternatives were discussed: yes     Risks discussed:  Need for additional repair, infection, pain and poor cosmetic result Universal protocol:    Patient identity confirmed:  Arm band Anesthesia:    Anesthesia method:  None Laceration details:    Location:  Finger   Finger location:  L index finger   Length (cm):  2 Pre-procedure  details:    Preparation:  Patient was prepped and draped in usual sterile fashion Exploration:    Hemostasis achieved with:  Direct pressure   Contaminated: no   Treatment:    Area cleansed with:  Povidone-iodine and saline   Irrigation solution:  Sterile saline   Irrigation method:  Pressure wash Skin repair:    Repair method:  Tissue adhesive Approximation:    Approximation:  Close Repair type:    Repair type:  Simple Post-procedure details:    Dressing:  Open (no dressing)   Procedure completion:  Tolerated well, no immediate complications     Medications Ordered in ED Medications - No data to display  ED Course/ Medical Decision Making/ A&P                                 Medical Decision Making  Patient here here with nailbed laceration.  Irrigated with saline and iodine.  Patient understands that she may have poor cosmetic outcome without removal of the nail and repair of the underlying nailbed.  She declines that.  Opted for tissue adhesive repair.  Patient is diabetic however this was a clean wound.  Do not think she needs antibiotics.  She is up-to-date on her tetanus vaccination.  She has obvious signs of neurovascular injury.  Have no suspicion for underlying fracture due to the superficial nature of the wound.  Patient treated here in the emergency department.  Discussed outpatient follow-up and return precautions.  Appropriate for discharge at this time        Final Clinical Impression(s) / ED Diagnoses Final diagnoses:  None    Rx / DC Orders ED Discharge Orders     None         Arthor Captain, PA-C 06/27/23 1038    Alvira Monday, MD 06/29/23 2142

## 2023-06-27 NOTE — ED Triage Notes (Signed)
Laceration to left 1st finger.  Bleeding controlled.

## 2023-06-27 NOTE — Discharge Instructions (Signed)
Do not scratch, rub, or pick at the adhesive. Leave tissue adhesive in place. It will come off naturally after 7-10 days. Do not place tape over the adhesive. The adhesive could come off the wound when you pull the tape off. Protect the wound from further injury until it is healed. Check your wound area every day for signs of infection. Check for: More redness, swelling, or pain,Fluid or blood,Warmth, Pus or a bad smell. Do not take baths, swim, or use a hot tub until your health care provider approves. You may only be allowed to take sponge baths. Ask your health care provider if you may take showers.You can usually shower after the first 24 hours. Cover the dressing with a watertight covering when you take a shower. Do not soak the area where there is tissue adhesive. Do not use any soaps, petroleum jelly products, or ointments on the wound. Certain ointments can weaken the adhesive.  

## 2023-06-27 NOTE — ED Notes (Signed)
Poured a container of sterile water and Iodine for soak for patient.

## 2023-06-27 NOTE — ED Notes (Signed)
Discharge paperwork reviewed entirely with patient, including follow up care. Pain was under control. No prescriptions were called in, but all questions were addressed.  Pt verbalized understanding as well as all parties involved. No questions or concerns voiced at the time of discharge. No acute distress noted.   Pt ambulated out to PVA without incident or assistance.
